# Patient Record
Sex: Male | Born: 1940 | Race: Black or African American | Hispanic: No | Marital: Married | Smoking: Current every day smoker
Health system: Southern US, Community
[De-identification: ages and names within clinical notes are randomized; demographics above are authoritative.]

## PROBLEM LIST (undated history)

## (undated) ENCOUNTER — Emergency Department (HOSPITAL_COMMUNITY): Admission: EM | Payer: Managed Care, Other (non HMO) | Source: Home / Self Care

## (undated) DIAGNOSIS — Z9289 Personal history of other medical treatment: Secondary | ICD-10-CM

## (undated) DIAGNOSIS — M1990 Unspecified osteoarthritis, unspecified site: Secondary | ICD-10-CM

## (undated) DIAGNOSIS — E785 Hyperlipidemia, unspecified: Secondary | ICD-10-CM

## (undated) DIAGNOSIS — I700 Atherosclerosis of aorta: Secondary | ICD-10-CM

## (undated) DIAGNOSIS — R0600 Dyspnea, unspecified: Secondary | ICD-10-CM

## (undated) DIAGNOSIS — I714 Abdominal aortic aneurysm, without rupture, unspecified: Secondary | ICD-10-CM

## (undated) DIAGNOSIS — Z720 Tobacco use: Secondary | ICD-10-CM

## (undated) DIAGNOSIS — I2510 Atherosclerotic heart disease of native coronary artery without angina pectoris: Secondary | ICD-10-CM

## (undated) DIAGNOSIS — D496 Neoplasm of unspecified behavior of brain: Secondary | ICD-10-CM

## (undated) DIAGNOSIS — I219 Acute myocardial infarction, unspecified: Secondary | ICD-10-CM

## (undated) DIAGNOSIS — I10 Essential (primary) hypertension: Secondary | ICD-10-CM

## (undated) HISTORY — DX: Neoplasm of unspecified behavior of brain: D49.6

## (undated) HISTORY — DX: Abdominal aortic aneurysm, without rupture, unspecified: I71.40

## (undated) HISTORY — DX: Atherosclerotic heart disease of native coronary artery without angina pectoris: I25.10

## (undated) HISTORY — DX: Acute myocardial infarction, unspecified: I21.9

## (undated) HISTORY — DX: Abdominal aortic aneurysm, without rupture: I71.4

## (undated) HISTORY — DX: Personal history of other medical treatment: Z92.89

## (undated) HISTORY — PX: BRAIN TUMOR EXCISION: SHX577

## (undated) HISTORY — DX: Tobacco use: Z72.0

## (undated) HISTORY — DX: Hyperlipidemia, unspecified: E78.5

## (undated) HISTORY — DX: Essential (primary) hypertension: I10

---

## 2006-05-29 ENCOUNTER — Emergency Department: Payer: Self-pay | Admitting: Emergency Medicine

## 2013-01-14 DIAGNOSIS — I219 Acute myocardial infarction, unspecified: Secondary | ICD-10-CM

## 2013-01-14 DIAGNOSIS — I2510 Atherosclerotic heart disease of native coronary artery without angina pectoris: Secondary | ICD-10-CM

## 2013-01-14 DIAGNOSIS — I214 Non-ST elevation (NSTEMI) myocardial infarction: Secondary | ICD-10-CM

## 2013-01-14 HISTORY — DX: Atherosclerotic heart disease of native coronary artery without angina pectoris: I25.10

## 2013-01-14 HISTORY — DX: Non-ST elevation (NSTEMI) myocardial infarction: I21.4

## 2013-01-14 HISTORY — PX: CARDIAC CATHETERIZATION: SHX172

## 2013-01-14 HISTORY — DX: Acute myocardial infarction, unspecified: I21.9

## 2013-01-25 ENCOUNTER — Inpatient Hospital Stay: Payer: Self-pay | Admitting: Internal Medicine

## 2013-01-25 DIAGNOSIS — I214 Non-ST elevation (NSTEMI) myocardial infarction: Secondary | ICD-10-CM

## 2013-01-25 DIAGNOSIS — I2510 Atherosclerotic heart disease of native coronary artery without angina pectoris: Secondary | ICD-10-CM

## 2013-01-25 DIAGNOSIS — I369 Nonrheumatic tricuspid valve disorder, unspecified: Secondary | ICD-10-CM

## 2013-01-25 HISTORY — PX: CARDIAC CATHETERIZATION: SHX172

## 2013-01-25 LAB — TROPONIN I: Troponin-I: 17 ng/mL — ABNORMAL HIGH

## 2013-01-25 LAB — BASIC METABOLIC PANEL
Anion Gap: 4 — ABNORMAL LOW (ref 7–16)
BUN: 7 mg/dL (ref 7–18)
Chloride: 105 mmol/L (ref 98–107)
Creatinine: 1.1 mg/dL (ref 0.60–1.30)
Glucose: 103 mg/dL — ABNORMAL HIGH (ref 65–99)
Osmolality: 272 (ref 275–301)
Potassium: 3.5 mmol/L (ref 3.5–5.1)
Sodium: 137 mmol/L (ref 136–145)

## 2013-01-25 LAB — CK TOTAL AND CKMB (NOT AT ARMC)
CK, Total: 868 U/L — ABNORMAL HIGH (ref 35–232)
CK, Total: 934 U/L — ABNORMAL HIGH (ref 35–232)
CK, Total: 788 U/L — ABNORMAL HIGH (ref 35–232)
CK-MB: 57.9 ng/mL — ABNORMAL HIGH (ref 0.5–3.6)
CK-MB: 44.1 ng/mL — ABNORMAL HIGH (ref 0.5–3.6)
CK-MB: 22.9 ng/mL — ABNORMAL HIGH (ref 0.5–3.6)

## 2013-01-25 LAB — APTT: Activated PTT: 36.5 secs — ABNORMAL HIGH (ref 23.6–35.9)

## 2013-01-25 LAB — CBC
HGB: 15.1 g/dL (ref 13.0–18.0)
MCV: 91 fL (ref 80–100)
RBC: 4.73 10*6/uL (ref 4.40–5.90)
WBC: 6.5 10*3/uL (ref 3.8–10.6)

## 2013-01-25 LAB — PROTIME-INR: INR: 1.1

## 2013-01-26 DIAGNOSIS — I214 Non-ST elevation (NSTEMI) myocardial infarction: Secondary | ICD-10-CM

## 2013-01-26 LAB — CBC WITH DIFFERENTIAL/PLATELET
Basophil #: 0.1 10*3/uL (ref 0.0–0.1)
Basophil %: 0.7 %
Eosinophil #: 0 10*3/uL (ref 0.0–0.7)
Eosinophil %: 0.1 %
HCT: 42 % (ref 40.0–52.0)
MCH: 31.7 pg (ref 26.0–34.0)
MCV: 92 fL (ref 80–100)
Neutrophil #: 5.8 10*3/uL (ref 1.4–6.5)
Platelet: 158 10*3/uL (ref 150–440)
RBC: 4.6 10*6/uL (ref 4.40–5.90)
WBC: 8.1 10*3/uL (ref 3.8–10.6)

## 2013-01-26 LAB — LIPID PANEL: HDL Cholesterol: 39 mg/dL — ABNORMAL LOW (ref 40–60)

## 2013-01-26 LAB — BASIC METABOLIC PANEL
Anion Gap: 9 (ref 7–16)
BUN: 9 mg/dL (ref 7–18)
Chloride: 104 mmol/L (ref 98–107)
Creatinine: 1.02 mg/dL (ref 0.60–1.30)
EGFR (African American): >60
EGFR (Non-African Amer.): >60
Osmolality: 272 (ref 275–301)
Potassium: 3.6 mmol/L (ref 3.5–5.1)
Sodium: 137 mmol/L (ref 136–145)

## 2013-01-26 LAB — TSH: Thyroid Stimulating Horm: 1.35 u[IU]/mL

## 2013-01-26 LAB — HEMOGLOBIN A1C: Hemoglobin A1C: 5.7 % (ref 4.2–6.3)

## 2013-02-12 ENCOUNTER — Ambulatory Visit (INDEPENDENT_AMBULATORY_CARE_PROVIDER_SITE_OTHER): Payer: Managed Care, Other (non HMO) | Admitting: Cardiovascular Disease

## 2013-02-12 ENCOUNTER — Encounter: Payer: Self-pay | Admitting: Cardiovascular Disease

## 2013-02-12 ENCOUNTER — Encounter: Payer: Self-pay | Admitting: *Deleted

## 2013-02-12 ENCOUNTER — Telehealth: Payer: Self-pay | Admitting: *Deleted

## 2013-02-12 VITALS — BP 108/74 | HR 62 | Ht 72.0 in | Wt 160.0 lb

## 2013-02-12 DIAGNOSIS — E785 Hyperlipidemia, unspecified: Secondary | ICD-10-CM

## 2013-02-12 DIAGNOSIS — F17200 Nicotine dependence, unspecified, uncomplicated: Secondary | ICD-10-CM

## 2013-02-12 DIAGNOSIS — Z720 Tobacco use: Secondary | ICD-10-CM

## 2013-02-12 DIAGNOSIS — I2510 Atherosclerotic heart disease of native coronary artery without angina pectoris: Secondary | ICD-10-CM

## 2013-02-12 DIAGNOSIS — I10 Essential (primary) hypertension: Secondary | ICD-10-CM

## 2013-02-12 NOTE — Patient Instructions (Addendum)
Fasting Labs in 1 month.  Continue same medications.  Try to quit smoking.  Follow up in 3 months.

## 2013-02-12 NOTE — Telephone Encounter (Signed)
Spoke with Lupita Leash from [facility] pt did not have refills on cardiac medications. She is aware that he can have meds filled for 6 refills. She will update the medications.

## 2013-02-13 ENCOUNTER — Encounter: Payer: Self-pay | Admitting: Cardiovascular Disease

## 2013-02-13 DIAGNOSIS — Z720 Tobacco use: Secondary | ICD-10-CM | POA: Insufficient documentation

## 2013-02-13 DIAGNOSIS — I10 Essential (primary) hypertension: Secondary | ICD-10-CM | POA: Insufficient documentation

## 2013-02-13 DIAGNOSIS — E785 Hyperlipidemia, unspecified: Secondary | ICD-10-CM | POA: Insufficient documentation

## 2013-02-13 NOTE — Assessment & Plan Note (Signed)
I had a prolonged discussion with them about the importance of smoking cessation. He is determined to quit smoking.

## 2013-02-13 NOTE — Progress Notes (Signed)
HPI  This is a 72 year old [place] American man who is here today for a followup visit regarding coronary artery disease. He presented recently to [facility] with non-ST elevation myocardial infarction. EKG showed no acute changes. Peak troponin was 22. Echocardiogram showed normal LV and RV systolic function without significant valvular abnormalities. He underwent urgent cardiac catheterization which showed moderate disease in the mid left circumflex and proximal RCA. Pressure wire interrogation was performed in both and was not obstructive. The left circumflex appeared slightly hazy and possibly the site of a plaque rupture of. No other etiology was found for his elevated troponin. D-dimer was normal and there was no strong clinical suspicion for pulmonary embolism. The patient was treated medically with no recurrent symptoms. He has been doing very well since hospital discharge. He denies any chest pain, dyspnea or palpitations.  No Known Allergies   No current outpatient prescriptions on file prior to visit.   No current facility-administered medications on file prior to visit.     Past Medical History  Diagnosis Date  . Brain tumor   . MI (myocardial infarction)   . Coronary artery disease 01/2013    Presented in June of 2014 with non-ST elevation myocardial infarction(peak troponin of 22). Cardiac catheterization showed moderate nonobstructive disease in the left circumflex and proximal RCA with an FFR ratio of 0.88 and 0.90 respectively. Ejection fraction was 70% and hyperdynamic. MI was likely due to plaque rupture without obstructive disease.   . Tobacco use   . Hypertension   . Hyperlipidemia      Past Surgical History  Procedure Laterality Date  . Brain tumor excision    . Cardiac catheterization  01/2013    [facility];EF 70 %     History reviewed. No pertinent family history.   History   Social History  . Marital Status: Married    Spouse Name: N/A    Number of Children:  N/A  . Years of Education: N/A   Occupational History  . Not on file.   Social History Main Topics  . Smoking status: Current Every Day Smoker -- 1.00 packs/day for 50 years    Types: Cigarettes  . Smokeless tobacco: Not on file  . Alcohol Use: No  . Drug Use: No  . Sexually Active: Not on file   Other Topics Concern  . Not on file   Social History Narrative  . No narrative on file     PHYSICAL EXAM   BP 108/74  Pulse 62  Ht 6' (1.829 m)  Wt 160 lb (72.576 kg)  BMI 21.7 kg/m2 Constitutional: He is oriented to person, place, and time. He appears well-developed and well-nourished. No distress.  HENT: No nasal discharge.  Head: Normocephalic and atraumatic.  Eyes: Pupils are equal and round. Right eye exhibits no discharge. Left eye exhibits no discharge.  Neck: Normal range of motion. Neck supple. No JVD present. No thyromegaly present.  Cardiovascular: Normal rate, regular rhythm, normal heart sounds and. Exam reveals no gallop and no friction rub. No murmur heard.  Pulmonary/Chest: Effort normal and breath sounds normal. No stridor. No respiratory distress. He has no wheezes. He has no rales. He exhibits no tenderness.  Abdominal: Soft. Bowel sounds are normal. He exhibits no distension. There is no tenderness. There is no rebound and no guarding.  Musculoskeletal: Normal range of motion. He exhibits no edema and no tenderness.  Neurological: He is alert and oriented to person, place, and time. Coordination normal.  Skin: Skin  is warm and dry. No rash noted. He is not diaphoretic. No erythema. No pallor.  Psychiatric: He has a normal mood and affect. His behavior is normal. Judgment and thought content normal.  Right radial pulse is normal with no hematoma   NGE:[id number]  Rhythm  WITHIN NORMAL LIMITS    ASSESSMENT AND PLAN

## 2013-02-13 NOTE — Assessment & Plan Note (Signed)
His blood pressure is well controlled on current medications. He did have bradycardia on initial presentation but seems to be tolerating small dose metoprolol.

## 2013-02-13 NOTE — Assessment & Plan Note (Signed)
The patient is doing well after her recent non-ST elevation myocardial infarction. No obstructive disease was found on cardiac catheterization. I recommend continuing medical therapy. He is to stay on dual antiplatelet therapy for one year. Continue treatment with small dose metoprolol, lisinopril, aspirin and Plavix.

## 2013-02-13 NOTE — Assessment & Plan Note (Signed)
Due to his recent myocardial infarction and moderate coronary artery disease, he was started on simvastatin. I will obtain a followup lipid and liver profile in one month

## 2013-02-19 ENCOUNTER — Telehealth: Payer: Self-pay

## 2013-02-19 NOTE — Telephone Encounter (Signed)
Pt asks if safe to be on both ASA 81 mg and plavix 75 mg daily He thinks he is on "too much blood thinner" I explained this is not "too much" and risk of his being off the plavix is greater than staying on it I advised he continue taking both ASA and Plavix as prescribed He denies blood from BM, urine, coughing up blood, etc He will continue meds as prescribed

## 2013-02-19 NOTE — Telephone Encounter (Signed)
Pt is taking aspirin and also a blood thinner, pt states he is too cold, and thinks his blood is too thin. Please call

## 2013-02-21 ENCOUNTER — Encounter: Payer: Self-pay | Admitting: *Deleted

## 2013-03-01 ENCOUNTER — Other Ambulatory Visit: Payer: Self-pay | Admitting: *Deleted

## 2013-03-01 MED ORDER — LISINOPRIL 5 MG PO TABS: 5.0000 mg | ORAL_TABLET | Freq: Every day | ORAL | Status: DC

## 2013-03-01 MED ORDER — SIMVASTATIN 40 MG PO TABS: 40.0000 mg | ORAL_TABLET | Freq: Every day | ORAL | Status: DC

## 2013-03-01 MED ORDER — METOPROLOL TARTRATE 25 MG PO TABS: 12.5000 mg | ORAL_TABLET | Freq: Two times a day (BID) | ORAL | Status: DC

## 2013-03-01 NOTE — Telephone Encounter (Signed)
Refilled Simvastatin, Lisinopril and Metoprolol sent to [facility] pharmacy.

## 2013-03-02 ENCOUNTER — Other Ambulatory Visit: Payer: Self-pay | Admitting: *Deleted

## 2013-03-02 MED ORDER — CLOPIDOGREL BISULFATE 75 MG PO TABS: 75.0000 mg | ORAL_TABLET | Freq: Every day | ORAL | Status: DC

## 2013-03-02 MED ORDER — LISINOPRIL 5 MG PO TABS: 5.0000 mg | ORAL_TABLET | Freq: Every day | ORAL | Status: DC

## 2013-03-02 MED ORDER — METOPROLOL TARTRATE 25 MG PO TABS: 12.5000 mg | ORAL_TABLET | Freq: Two times a day (BID) | ORAL | Status: DC

## 2013-03-02 MED ORDER — SIMVASTATIN 40 MG PO TABS: 40.0000 mg | ORAL_TABLET | Freq: Every day | ORAL | Status: DC

## 2013-03-02 NOTE — Telephone Encounter (Signed)
Refilled Clopidogrel sent to [facility] pharmacy. 

## 2013-03-02 NOTE — Telephone Encounter (Signed)
Refilled Clopidogrel, simvastatin, metoprolol and lisinopril sent to [facility].

## 2013-03-13 ENCOUNTER — Ambulatory Visit (INDEPENDENT_AMBULATORY_CARE_PROVIDER_SITE_OTHER): Payer: Medicare Other

## 2013-03-13 ENCOUNTER — Other Ambulatory Visit: Payer: Medicare Other

## 2013-03-13 DIAGNOSIS — E785 Hyperlipidemia, unspecified: Secondary | ICD-10-CM

## 2013-03-14 LAB — LIPID PANEL
Cholesterol, Total: 137 mg/dL (ref 100–199)
HDL: 29 mg/dL — ABNORMAL LOW (ref >39)
VLDL Cholesterol Cal: 19 mg/dL (ref 5–40)

## 2013-03-14 LAB — HEPATIC FUNCTION PANEL: ALT: 12 IU/L (ref 0–44)

## 2013-03-30 ENCOUNTER — Telehealth: Payer: Self-pay | Admitting: *Deleted

## 2013-03-30 NOTE — Telephone Encounter (Signed)
Spoke with pt, he works as a Engineer, water in the school. He was given the okay to return to work.

## 2013-03-30 NOTE — Telephone Encounter (Signed)
Patient wants to know when he can go back to work.

## 2013-05-08 ENCOUNTER — Ambulatory Visit: Payer: Medicare Other | Admitting: Cardiovascular Disease

## 2013-05-10 ENCOUNTER — Ambulatory Visit: Payer: Medicare Other | Admitting: Cardiovascular Disease

## 2013-05-24 ENCOUNTER — Encounter: Payer: Self-pay | Admitting: Cardiovascular Disease

## 2013-05-24 ENCOUNTER — Ambulatory Visit (INDEPENDENT_AMBULATORY_CARE_PROVIDER_SITE_OTHER): Payer: Medicare Other | Admitting: Cardiovascular Disease

## 2013-05-24 VITALS — BP 132/89 | HR 50 | Ht 72.0 in | Wt 160.5 lb

## 2013-05-24 DIAGNOSIS — I2510 Atherosclerotic heart disease of native coronary artery without angina pectoris: Secondary | ICD-10-CM

## 2013-05-24 DIAGNOSIS — F17200 Nicotine dependence, unspecified, uncomplicated: Secondary | ICD-10-CM

## 2013-05-24 DIAGNOSIS — Z720 Tobacco use: Secondary | ICD-10-CM

## 2013-05-24 DIAGNOSIS — E785 Hyperlipidemia, unspecified: Secondary | ICD-10-CM

## 2013-05-24 DIAGNOSIS — I10 Essential (primary) hypertension: Secondary | ICD-10-CM

## 2013-05-24 NOTE — Assessment & Plan Note (Signed)
Blood pressure is reasonably controlled. I might consider stopping his metoprolol and increasing lisinopril in the future.

## 2013-05-24 NOTE — Assessment & Plan Note (Signed)
I discussed with him again the importance of smoking cessation.

## 2013-05-24 NOTE — Patient Instructions (Signed)
Continue same medications.   Your physician wants you to follow-up in: 6 months.  You will receive a reminder letter in the mail two months in advance. If you don't receive a letter, please call our office to schedule the follow-up appointment.  

## 2013-05-24 NOTE — Assessment & Plan Note (Signed)
Lab Results  Component Value Date   HDL 29* 03/13/2013   LDLCALC 89 03/13/2013   TRIG 93 03/13/2013   CHOLHDL 4.7 03/13/2013   Continue treatment with simvastatin. 

## 2013-05-24 NOTE — Assessment & Plan Note (Signed)
He is doing very well at this time with no recurrent chest pain. I recommend dual antiplatelet therapy for at least one year.

## 2013-05-24 NOTE — Progress Notes (Signed)
HPI  This is a 72 year old [place] American man who is here today for a followup visit regarding coronary artery disease. He presented in 01/2013 to [facility] with non-ST elevation myocardial infarction. EKG showed no acute changes. Peak troponin was 22. Echocardiogram showed normal LV and RV systolic function without significant valvular abnormalities. He underwent urgent cardiac catheterization which showed moderate disease in the mid left circumflex and proximal RCA. Pressure wire interrogation was performed in both and was not obstructive. The left circumflex appeared slightly hazy and possibly the site of a plaque rupture . No other etiology was found for his elevated troponin. D-dimer was normal and there was no strong clinical suspicion for pulmonary embolism. The patient was treated medically with no recurrent symptoms.  He has been doing very well since hospital discharge. He denies any chest pain, dyspnea or palpitations. He has mild bradycardia but denies any dizziness.   No Known Allergies   Current Outpatient Prescriptions on File Prior to Visit  Medication Sig Dispense Refill  . aspirin 81 MG tablet Take 81 mg by mouth daily.      . clopidogrel (PLAVIX) 75 MG tablet Take 1 tablet (75 mg total) by mouth daily.  30 tablet  3  . lisinopril (PRINIVIL,ZESTRIL) 5 MG tablet Take 1 tablet (5 mg total) by mouth daily.  30 tablet  3  . metoprolol tartrate (LOPRESSOR) 25 MG tablet Take 0.5 tablets (12.5 mg total) by mouth 2 (two) times daily.  30 tablet  3  . NITROSTAT 0.4 MG SL tablet Place 0.4 mg under the tongue every 5 (five) minutes as needed.       . simvastatin (ZOCOR) 40 MG tablet Take 1 tablet (40 mg total) by mouth at bedtime.  30 tablet  3   No current facility-administered medications on file prior to visit.     Past Medical History  Diagnosis Date  . Brain tumor   . MI (myocardial infarction)   . Coronary artery disease 01/2013    Presented in June of 2014 with non-ST  elevation myocardial infarction(peak troponin of 22). Cardiac catheterization showed moderate nonobstructive disease in the left circumflex and proximal RCA with an FFR ratio of 0.88 and 0.90 respectively. Ejection fraction was 70% and hyperdynamic. MI was likely due to plaque rupture without obstructive disease.   . Tobacco use   . Hypertension   . Hyperlipidemia      Past Surgical History  Procedure Laterality Date  . Brain tumor excision    . Cardiac catheterization  01/2013    [facility];EF 70 %     Family History  Problem Relation Age of Onset  . Family history unknown: Yes     History   Social History  . Marital Status: Married    Spouse Name: N/A    Number of Children: N/A  . Years of Education: N/A   Occupational History  . Not on file.   Social History Main Topics  . Smoking status: Current Every Day Smoker -- 1.00 packs/day for 50 years    Types: Cigarettes  . Smokeless tobacco: Not on file  . Alcohol Use: No  . Drug Use: No  . Sexual Activity: Not on file   Other Topics Concern  . Not on file   Social History Narrative  . No narrative on file     PHYSICAL EXAM   BP 132/89  Pulse 50  Ht 6' (1.829 m)  Wt 160 lb 8 oz (72.802 kg)  BMI 21.76  kg/m2 Constitutional: He is oriented to person, place, and time. He appears well-developed and well-nourished. No distress.  HENT: No nasal discharge.  Head: Normocephalic and atraumatic.  Eyes: Pupils are equal and round. Right eye exhibits no discharge. Left eye exhibits no discharge.  Neck: Normal range of motion. Neck supple. No JVD present. No thyromegaly present.  Cardiovascular: Normal rate, regular rhythm, normal heart sounds and. Exam reveals no gallop and no friction rub. No murmur heard.  Pulmonary/Chest: Effort normal and breath sounds normal. No stridor. No respiratory distress. He has no wheezes. He has no rales. He exhibits no tenderness.  Abdominal: Soft. Bowel sounds are normal. He exhibits no  distension. There is no tenderness. There is no rebound and no guarding.  Musculoskeletal: Normal range of motion. He exhibits no edema and no tenderness.  Neurological: He is alert and oriented to person, place, and time. Coordination normal.  Skin: Skin is warm and dry. No rash noted. He is not diaphoretic. No erythema. No pallor.  Psychiatric: He has a normal mood and affect. His behavior is normal. Judgment and thought content normal.     EKG: sinus bradycardia with early repolarization  ASSESSMENT AND PLAN

## 2013-06-08 ENCOUNTER — Telehealth: Payer: Self-pay

## 2013-06-08 NOTE — Telephone Encounter (Signed)
Received fax from cardiac rehab at [facility] that pt did not show for appt, "no desire to do program".

## 2013-06-29 ENCOUNTER — Other Ambulatory Visit: Payer: Self-pay

## 2013-06-29 MED ORDER — LISINOPRIL 5 MG PO TABS: 5.0000 mg | ORAL_TABLET | Freq: Every day | ORAL | Status: DC

## 2013-06-29 MED ORDER — SIMVASTATIN 40 MG PO TABS: 40.0000 mg | ORAL_TABLET | Freq: Every day | ORAL | Status: DC

## 2013-06-29 MED ORDER — METOPROLOL TARTRATE 25 MG PO TABS: 12.5000 mg | ORAL_TABLET | Freq: Two times a day (BID) | ORAL | Status: DC

## 2013-06-29 MED ORDER — CLOPIDOGREL BISULFATE 75 MG PO TABS: 75.0000 mg | ORAL_TABLET | Freq: Every day | ORAL | Status: DC

## 2013-06-29 NOTE — Telephone Encounter (Signed)
Requested Prescriptions   Signed Prescriptions Disp Refills  . simvastatin (ZOCOR) 40 MG tablet 30 tablet 3    Sig: Take 1 tablet (40 mg total) by mouth at bedtime.    Authorizing Provider: Lorine Bears A    Ordering User: Shawnie Dapper, MARINA C  . lisinopril (PRINIVIL,ZESTRIL) 5 MG tablet 30 tablet 3    Sig: Take 1 tablet (5 mg total) by mouth daily.    Authorizing Provider: Lorine Bears A    Ordering User: Iverson Alamin C  . clopidogrel (PLAVIX) 75 MG tablet 30 tablet 3    Sig: Take 1 tablet (75 mg total) by mouth daily.    Authorizing Provider: Lorine Bears A    Ordering User: Shawnie Dapper, MARINA C  . metoprolol tartrate (LOPRESSOR) 25 MG tablet 30 tablet 3    Sig: Take 0.5 tablets (12.5 mg total) by mouth 2 (two) times daily.    Authorizing Provider: Lorine Bears A    Ordering User: Kendrick Fries

## 2013-07-16 ENCOUNTER — Telehealth: Payer: Self-pay

## 2013-07-16 ENCOUNTER — Telehealth: Payer: Self-pay | Admitting: *Deleted

## 2013-07-16 NOTE — Telephone Encounter (Signed)
This encounter was created in error - please disregard.

## 2013-07-16 NOTE — Telephone Encounter (Signed)
Called pt to inform him that his message was recd and sent to MD.

## 2013-07-16 NOTE — Telephone Encounter (Signed)
Pt states it is ok to leave a msg on his vm

## 2013-07-16 NOTE — Telephone Encounter (Signed)
Pt questions if he can take Alka Seltzer Plus and Nyquil. Please advise.

## 2013-07-16 NOTE — Telephone Encounter (Signed)
Left detailed message for pt with instructions.   Asked pt to call back with any questions or concerns.

## 2013-07-16 NOTE — Telephone Encounter (Signed)
Nyquil cough is ok. He should avoid Alka Seltzer plus because it has it has phenylephrine. In general , he should avoid decongestants.

## 2013-07-16 NOTE — Telephone Encounter (Signed)
Alka seltzer plus and niquil for a cold. Please advise (second call per [place])

## 2013-07-20 ENCOUNTER — Emergency Department: Payer: Self-pay | Admitting: Emergency Medicine

## 2013-07-20 LAB — COMPREHENSIVE METABOLIC PANEL
Albumin: 3.7 g/dL (ref 3.4–5.0)
Anion Gap: 2 — ABNORMAL LOW (ref 7–16)
BUN: 12 mg/dL (ref 7–18)
Creatinine: 1.22 mg/dL (ref 0.60–1.30)
EGFR (African American): >60
EGFR (Non-African Amer.): 60 — ABNORMAL LOW
Osmolality: 264 (ref 275–301)
Potassium: 3.5 mmol/L (ref 3.5–5.1)
SGOT(AST): 59 U/L — ABNORMAL HIGH (ref 15–37)
SGPT (ALT): 29 U/L (ref 12–78)
Sodium: 131 mmol/L — ABNORMAL LOW (ref 136–145)

## 2013-07-20 LAB — CK TOTAL AND CKMB (NOT AT ARMC): CK-MB: 0.6 ng/mL (ref 0.5–3.6)

## 2013-07-20 LAB — CBC
HCT: 44.3 % (ref 40.0–52.0)
MCH: 31.5 pg (ref 26.0–34.0)
MCHC: 34.3 g/dL (ref 32.0–36.0)
MCV: 92 fL (ref 80–100)
Platelet: 103 10*3/uL — ABNORMAL LOW (ref 150–440)
RBC: 4.82 10*6/uL (ref 4.40–5.90)

## 2013-07-20 LAB — TROPONIN I: Troponin-I: <0.02 ng/mL

## 2013-11-22 ENCOUNTER — Ambulatory Visit (INDEPENDENT_AMBULATORY_CARE_PROVIDER_SITE_OTHER): Payer: Managed Care, Other (non HMO) | Admitting: Cardiovascular Disease

## 2013-11-22 ENCOUNTER — Encounter: Payer: Self-pay | Admitting: Cardiovascular Disease

## 2013-11-22 VITALS — BP 123/75 | HR 49 | Ht 73.0 in | Wt 164.8 lb

## 2013-11-22 DIAGNOSIS — I10 Essential (primary) hypertension: Secondary | ICD-10-CM

## 2013-11-22 DIAGNOSIS — F17200 Nicotine dependence, unspecified, uncomplicated: Secondary | ICD-10-CM

## 2013-11-22 DIAGNOSIS — Z720 Tobacco use: Secondary | ICD-10-CM

## 2013-11-22 DIAGNOSIS — R200 Anesthesia of skin: Secondary | ICD-10-CM

## 2013-11-22 DIAGNOSIS — R209 Unspecified disturbances of skin sensation: Secondary | ICD-10-CM

## 2013-11-22 DIAGNOSIS — I2510 Atherosclerotic heart disease of native coronary artery without angina pectoris: Secondary | ICD-10-CM

## 2013-11-22 DIAGNOSIS — E785 Hyperlipidemia, unspecified: Secondary | ICD-10-CM

## 2013-11-22 MED ORDER — LISINOPRIL 10 MG PO TABS: 10.0000 mg | ORAL_TABLET | Freq: Every day | ORAL | Status: DC

## 2013-11-22 NOTE — Assessment & Plan Note (Signed)
I stopped metoprolol and increased lisinopril to 10 mg once daily due to bradycardia.

## 2013-11-22 NOTE — Patient Instructions (Signed)
Your physician has recommended you make the following change in your medication:  Stop Metoprolol  Increase Lisinopril to 10 mg once daily   Your physician wants you to follow-up in: 6 months. You will receive a reminder letter in the mail two months in advance. If you don't receive a letter, please call our office to schedule the follow-up appointment.

## 2013-11-22 NOTE — Progress Notes (Signed)
HPI  This is a 73 year old [place] American man who is here today for a followup visit regarding coronary artery disease. He presented in 01/2013 to [facility] with non-ST elevation myocardial infarction. EKG showed no acute changes. Peak troponin was 22. Echocardiogram showed normal LV and RV systolic function without significant valvular abnormalities. He underwent urgent cardiac catheterization which showed moderate disease in the mid left circumflex and proximal RCA. Pressure wire interrogation was performed on both and was not obstructive. The left circumflex appeared slightly hazy and possibly the site of a plaque rupture . No other etiology was found for his elevated troponin. D-dimer was normal and there was no strong clinical suspicion for pulmonary embolism. The patient was treated medically with no recurrent symptoms.  He has been doing very well since hospital discharge. He denies any chest pain, dyspnea or palpitations. He has mild bradycardia but denies any dizziness.  He continues to be bradycardic but denies symptoms. He continues to smoke half a pack per day.  No Known Allergies   Current Outpatient Prescriptions on File Prior to Visit  Medication Sig Dispense Refill  . aspirin 81 MG tablet Take 81 mg by mouth daily.      . clopidogrel (PLAVIX) 75 MG tablet Take 1 tablet (75 mg total) by mouth daily.  30 tablet  3  . lisinopril (PRINIVIL,ZESTRIL) 5 MG tablet Take 1 tablet (5 mg total) by mouth daily.  30 tablet  3  . metoprolol tartrate (LOPRESSOR) 25 MG tablet Take 0.5 tablets (12.5 mg total) by mouth 2 (two) times daily.  30 tablet  3  . NITROSTAT 0.4 MG SL tablet Place 0.4 mg under the tongue every 5 (five) minutes as needed.       . simvastatin (ZOCOR) 40 MG tablet Take 1 tablet (40 mg total) by mouth at bedtime.  30 tablet  3   No current facility-administered medications on file prior to visit.     Past Medical History  Diagnosis Date  . Brain tumor   . MI (myocardial  infarction)   . Coronary artery disease 01/2013    Presented in June of 2014 with non-ST elevation myocardial infarction(peak troponin of 22). Cardiac catheterization showed moderate nonobstructive disease in the left circumflex and proximal RCA with an FFR ratio of 0.88 and 0.90 respectively. Ejection fraction was 70% and hyperdynamic. MI was likely due to plaque rupture without obstructive disease.   . Tobacco use   . Hypertension   . Hyperlipidemia      Past Surgical History  Procedure Laterality Date  . Brain tumor excision    . Cardiac catheterization  01/2013    [facility];EF 70 %     Family History  Problem Relation Age of Onset  . Family history unknown: Yes     History   Social History  . Marital Status: Married    Spouse Name: N/A    Number of Children: N/A  . Years of Education: N/A   Occupational History  . Not on file.   Social History Main Topics  . Smoking status: Current Every Day Smoker -- 1.00 packs/day for 50 years    Types: Cigarettes  . Smokeless tobacco: Not on file  . Alcohol Use: No  . Drug Use: No  . Sexual Activity: Not on file   Other Topics Concern  . Not on file   Social History Narrative  . No narrative on file     PHYSICAL EXAM   BP 123/75  Pulse 49  Ht 6\' 1"  (1.854 m)  Wt 164 lb 12 oz (74.73 kg)  BMI 21.74 kg/m2 Constitutional: He is oriented to person, place, and time. He appears well-developed and well-nourished. No distress.  HENT: No nasal discharge.  Head: Normocephalic and atraumatic.  Eyes: Pupils are equal and round. Right eye exhibits no discharge. Left eye exhibits no discharge.  Neck: Normal range of motion. Neck supple. No JVD present. No thyromegaly present.  Cardiovascular: Bradycardic, regular rhythm, normal heart sounds and. Exam reveals no gallop and no friction rub. No murmur heard.  Pulmonary/Chest: Effort normal and breath sounds normal. No stridor. No respiratory distress. He has no wheezes. He has no rales.  He exhibits no tenderness.  Abdominal: Soft. Bowel sounds are normal. He exhibits no distension. There is no tenderness. There is no rebound and no guarding.  Musculoskeletal: Normal range of motion. He exhibits no edema and no tenderness.  Neurological: He is alert and oriented to person, place, and time. Coordination normal.  Skin: Skin is warm and dry. No rash noted. He is not diaphoretic. No erythema. No pallor.  Psychiatric: He has a normal mood and affect. His behavior is normal. Judgment and thought content normal.     EKG: Marked sinus  Bradycardia  Low voltage in limb leads.   -ST depression   +   T-abnormality  - Inferior ischemia.   ABNORMAL    ASSESSMENT AND PLAN

## 2013-11-22 NOTE — Assessment & Plan Note (Signed)
Lab Results  Component Value Date   HDL 29* 03/13/2013   LDLCALC 89 03/13/2013   TRIG 93 03/13/2013   CHOLHDL 4.7 03/13/2013   Continue treatment with simvastatin.

## 2013-11-22 NOTE — Assessment & Plan Note (Signed)
EKG significantly abnormal today. However, he is completely asymptomatic. Continue medical therapy. Given her significant bradycardia, I stopped metoprolol.

## 2013-11-22 NOTE — Assessment & Plan Note (Signed)
I discussed with him the importance of smoking cessation. 

## 2014-01-21 ENCOUNTER — Telehealth: Payer: Self-pay | Admitting: *Deleted

## 2014-01-21 NOTE — Telephone Encounter (Signed)
Patient called and wants to know if he should continue his blood thinner (clopidogrer 75mg )?

## 2014-01-22 ENCOUNTER — Telehealth: Payer: Self-pay | Admitting: *Deleted

## 2014-01-22 NOTE — Telephone Encounter (Signed)
We can stop Plavix now. Continue other medications.

## 2014-01-22 NOTE — Telephone Encounter (Signed)
Patient called and asked if he should continue his Plavix  He stated he thought he should only be on it for a year and the end of the year is close

## 2014-01-23 NOTE — Telephone Encounter (Signed)
Informed patient that per Dr. Fletcher Anon he can stop his Plavix and continue his other meds  Patient verbalized understanding

## 2014-02-13 NOTE — Telephone Encounter (Signed)
This encounter was created in error - please disregard.

## 2014-02-28 ENCOUNTER — Other Ambulatory Visit: Payer: Self-pay | Admitting: *Deleted

## 2014-02-28 MED ORDER — SIMVASTATIN 40 MG PO TABS: 40.0000 mg | ORAL_TABLET | Freq: Every day | ORAL | Status: DC

## 2014-02-28 NOTE — Telephone Encounter (Signed)
Requested Prescriptions   Signed Prescriptions Disp Refills  . simvastatin (ZOCOR) 40 MG tablet 30 tablet 3    Sig: Take 1 tablet (40 mg total) by mouth at bedtime.    Authorizing Provider: Kathlyn Sacramento A    Ordering User: Britt Bottom

## 2014-05-30 ENCOUNTER — Ambulatory Visit (INDEPENDENT_AMBULATORY_CARE_PROVIDER_SITE_OTHER): Payer: Medicare Other | Admitting: Cardiovascular Disease

## 2014-05-30 ENCOUNTER — Encounter: Payer: Self-pay | Admitting: Cardiovascular Disease

## 2014-05-30 VITALS — BP 138/90 | HR 56 | Ht 72.0 in | Wt 164.2 lb

## 2014-05-30 DIAGNOSIS — E785 Hyperlipidemia, unspecified: Secondary | ICD-10-CM

## 2014-05-30 DIAGNOSIS — I10 Essential (primary) hypertension: Secondary | ICD-10-CM

## 2014-05-30 DIAGNOSIS — I2510 Atherosclerotic heart disease of native coronary artery without angina pectoris: Secondary | ICD-10-CM

## 2014-05-30 DIAGNOSIS — Z720 Tobacco use: Secondary | ICD-10-CM

## 2014-05-30 MED ORDER — ATORVASTATIN CALCIUM 20 MG PO TABS: 20.0000 mg | ORAL_TABLET | Freq: Every day | ORAL | Status: DC

## 2014-05-30 NOTE — Assessment & Plan Note (Signed)
Blood pressure is reasonably controlled on lisinopril.

## 2014-05-30 NOTE — Patient Instructions (Signed)
Stop Simvastatin.  Start Atorvastatin 20 mg once daily.   Check fasting labs in 6 weeks.   Your physician wants you to follow-up in: 6 months.  You will receive a reminder letter in the mail two months in advance. If you don't receive a letter, please call our office to schedule the follow-up appointment.  Your next appointment will be scheduled in our new office located at :  [place]  [address], [place]  [place], [place] [address]

## 2014-05-30 NOTE — Assessment & Plan Note (Signed)
Due to possible myalgia with simvastatin, I switched him to atorvastatin 20 mg daily. Check fasting lipid and liver profile in 6 weeks.

## 2014-05-30 NOTE — Assessment & Plan Note (Signed)
He is doing well overall with no symptoms suggestive of angina. Continue medical therapy. Plavix was discontinued after 12 months of treatment.

## 2014-05-30 NOTE — Assessment & Plan Note (Signed)
I again discussed with him the importance of complete cessation. He is down to a few cigarettes a day.

## 2014-05-30 NOTE — Progress Notes (Signed)
HPI  This is a 73 year old [place] American man who is here today for a followup visit regarding coronary artery disease. He presented in 01/2013 to [facility] with non-ST elevation myocardial infarction. EKG showed no acute changes. Peak troponin was 22. Echocardiogram showed normal LV and RV systolic function without significant valvular abnormalities. He underwent urgent cardiac catheterization which showed moderate disease in the mid left circumflex and proximal RCA. Pressure wire interrogation was performed on both and was not obstructive. The left circumflex appeared slightly hazy and possibly the site of a plaque rupture . No other etiology was found for his elevated troponin. D-dimer was normal and there was no strong clinical suspicion for pulmonary embolism. The patient was treated medically with no recurrent symptoms.  He denies any chest pain, dyspnea or palpitations. He has mild bradycardia but denies any dizziness. Metoprolol was discontinued.  He continues to smoke but is down to a few cigarettes a day. He complains of some on discomfort and thinks it might be related to simvastatin.  No Known Allergies   Current Outpatient Prescriptions on File Prior to Visit  Medication Sig Dispense Refill  . aspirin 81 MG tablet Take 81 mg by mouth daily.      Marland Kitchen lisinopril (PRINIVIL,ZESTRIL) 10 MG tablet Take 1 tablet (10 mg total) by mouth daily.  90 tablet  3  . NITROSTAT 0.4 MG SL tablet Place 0.4 mg under the tongue every 5 (five) minutes as needed.       . simvastatin (ZOCOR) 40 MG tablet Take 1 tablet (40 mg total) by mouth at bedtime.  30 tablet  3   No current facility-administered medications on file prior to visit.     Past Medical History  Diagnosis Date  . Brain tumor   . MI (myocardial infarction)   . Coronary artery disease 01/2013    Presented in June of 2014 with non-ST elevation myocardial infarction(peak troponin of 22). Cardiac catheterization showed moderate  nonobstructive disease in the left circumflex and proximal RCA with an FFR ratio of 0.88 and 0.90 respectively. Ejection fraction was 70% and hyperdynamic. MI was likely due to plaque rupture without obstructive disease.   . Tobacco use   . Hypertension   . Hyperlipidemia      Past Surgical History  Procedure Laterality Date  . Brain tumor excision    . Cardiac catheterization  01/2013    [facility];EF 70 %     History reviewed. No pertinent family history.   History   Social History  . Marital Status: Married    Spouse Name: N/A    Number of Children: N/A  . Years of Education: N/A   Occupational History  . Not on file.   Social History Main Topics  . Smoking status: Current Every Day Smoker -- 1.00 packs/day for 50 years    Types: Cigarettes  . Smokeless tobacco: Not on file  . Alcohol Use: No  . Drug Use: No  . Sexual Activity: Not on file   Other Topics Concern  . Not on file   Social History Narrative  . No narrative on file     PHYSICAL EXAM   BP 138/90  Pulse 56  Ht 6' (1.829 m)  Wt 164 lb 4 oz (74.503 kg)  BMI 22.27 kg/m2 Constitutional: He is oriented to person, place, and time. He appears well-developed and well-nourished. No distress.  HENT: No nasal discharge.  Head: Normocephalic and atraumatic.  Eyes: Pupils are equal and round.  Right eye exhibits no discharge. Left eye exhibits no discharge.  Neck: Normal range of motion. Neck supple. No JVD present. No thyromegaly present.  Cardiovascular: Bradycardic, regular rhythm, normal heart sounds and. Exam reveals no gallop and no friction rub. No murmur heard.  Pulmonary/Chest: Effort normal and breath sounds normal. No stridor. No respiratory distress. He has no wheezes. He has no rales. He exhibits no tenderness.  Abdominal: Soft. Bowel sounds are normal. He exhibits no distension. There is no tenderness. There is no rebound and no guarding.  Musculoskeletal: Normal range of motion. He exhibits no edema  and no tenderness.  Neurological: He is alert and oriented to person, place, and time. Coordination normal.  Skin: Skin is warm and dry. No rash noted. He is not diaphoretic. No erythema. No pallor.  Psychiatric: He has a normal mood and affect. His behavior is normal. Judgment and thought content normal.     EKG: Sinus  Bradycardia  WITHIN NORMAL LIMITS   ASSESSMENT AND PLAN

## 2014-07-09 ENCOUNTER — Other Ambulatory Visit (INDEPENDENT_AMBULATORY_CARE_PROVIDER_SITE_OTHER): Payer: Medicare Other

## 2014-07-09 DIAGNOSIS — E785 Hyperlipidemia, unspecified: Secondary | ICD-10-CM

## 2014-07-10 LAB — HEPATIC FUNCTION PANEL
ALT: 9 IU/L (ref 0–44)
AST: 14 IU/L (ref 0–40)
Albumin: 4.5 g/dL (ref 3.5–4.8)
Alkaline Phosphatase: 74 IU/L (ref 39–117)
BILIRUBIN DIRECT: 0.13 mg/dL (ref 0.00–0.40)
Total Bilirubin: 0.5 mg/dL (ref 0.0–1.2)
Total Protein: 7.4 g/dL (ref 6.0–8.5)

## 2014-07-10 LAB — LIPID PANEL
Chol/HDL Ratio: 4.2 ratio units (ref 0.0–5.0)
Cholesterol, Total: 130 mg/dL (ref 100–199)
HDL: 31 mg/dL — AB (ref >39)
LDL CALC: 82 mg/dL (ref 0–99)
TRIGLYCERIDES: 85 mg/dL (ref 0–149)
VLDL Cholesterol Cal: 17 mg/dL (ref 5–40)

## 2014-07-12 ENCOUNTER — Other Ambulatory Visit: Payer: Medicare Other

## 2014-07-16 NOTE — Progress Notes (Signed)
LVM 12/01

## 2014-07-17 ENCOUNTER — Telehealth: Payer: Self-pay | Admitting: Cardiovascular Disease

## 2014-07-17 NOTE — Telephone Encounter (Signed)
See result note.  

## 2014-07-17 NOTE — Telephone Encounter (Signed)
Calling about labs, wants his results. Please call patient

## 2014-09-10 ENCOUNTER — Telehealth: Payer: Self-pay | Admitting: Cardiovascular Disease

## 2014-09-10 NOTE — Telephone Encounter (Signed)
Patient is taking simvastatin and was changes to atorvastatin  He stated he is still having pain in his legs On the days he forgets to take his atorvastatin his legs do not hurt as bad  He is having numbness in his arms  He thinks this is all related to his medication  He wants to know if he can stop this medication since his cholesterol is not elevated

## 2014-09-10 NOTE — Telephone Encounter (Signed)
See below

## 2014-09-10 NOTE — Telephone Encounter (Signed)
Pt is calling stating that the crestor that was changed in dosage, it doesn't seem to be working.  May want to come off it, could not hardly understand what he wanted me to do.  Pt stated that he is still getting numbness in the arm and doesn't know why.   Please call and if you can't get a hold of him, please leave message on answering machine.

## 2014-09-11 MED ORDER — PRAVASTATIN SODIUM 40 MG PO TABS: 40.0000 mg | ORAL_TABLET | Freq: Every evening | ORAL | Status: DC

## 2014-09-11 NOTE — Telephone Encounter (Signed)
Switch Atorvastatin to Pravastatin 40 mg daily.

## 2014-09-11 NOTE — Telephone Encounter (Signed)
Informed patient of Dr. Jacklynn Ganong instructions  Patient verbalized understanding  Instruction patient to call if issues persist

## 2014-12-06 NOTE — Consult Note (Signed)
General Aspect This is a 74 year old african American male with prolonged hiustory of smoking but no other health issues who presented with substernal chest aching and tightness which started around noon yesterday. This was associated with nausea and diaphoresis. No previous similar episodes.  In ED: he is noted to have stable vital signs. ECG without ischemic changes. TnI: 17. The patient was started on Aspirin and Heparin. However, he continues to complain of mild chest discomfort.   Physical Exam:  GEN no acute distress   NECK supple   RESP normal resp effort  clear BS   CARD Regular rate and rhythm  No murmur   ABD denies tenderness   LYMPH negative neck   EXTR negative cyanosis/clubbing, negative edema   PSYCH alert, A+O to time, place, person   Review of Systems:  Subjective/Chief Complaint chest pain   General: Fatigue   Skin: No Complaints   ENT: No Complaints   Eyes: No Complaints   Neck: No Complaints   Respiratory: No Complaints   Cardiovascular: Chest pain or discomfort  Tightness   Gastrointestinal: No Complaints   Genitourinary: No Complaints   Vascular: No Complaints   Musculoskeletal: No Complaints   Neurologic: No Complaints   Hematologic: No Complaints   Endocrine: No Complaints   Psychiatric: No Complaints   Review of Systems: All other systems were reviewed and found to be negative     denies med hx:    Brain Surgery:   Lab Results: Routine Chem:  12-Jun-14 05:09   Glucose, Serum  103  BUN 7  Creatinine (comp) 1.10  Sodium, Serum 137  Potassium, Serum 3.5  Chloride, Serum 105  CO2, Serum 28  Calcium (Total), Serum 9.5  Anion Gap  4  Osmolality (calc) 272  eGFR (African American) >60  eGFR (Non-African American) >60 (eGFR values <14m/min/1.73 m2 may be an indication of chronic kidney disease (CKD). Calculated eGFR is useful in patients with stable renal function. The eGFR calculation will not be reliable in acutely  ill patients when serum creatinine is changing rapidly. It is not useful in  patients on dialysis. The eGFR calculation may not be applicable to patients at the low and high extremes of body sizes, pregnant women, and vegetarians.)  Result Comment TROPONIN - RESULTS VERIFIED BY REPEAT TESTING.  - C/ [email or web address] _0  01-25-13. AJO  - READ-BACK PROCESS PERFORMED.  Result(s) reported on 25 Jan 2013 at 05:57AM.  Cardiac:  12-Jun-14 05:09   CK, Total  868  CPK-MB, Serum  57.9 (Result(s) reported on 25 Jan 2013 at 05:52AM.)  Troponin I  17.00 (0.00-0.05 0.05 ng/mL or less: NEGATIVE  Repeat testing in 3-6 hrs  if clinically indicated. >0.05 ng/mL: POTENTIAL  MYOCARDIAL INJURY. Repeat  testing in 3-6 hrs if  clinically indicated. NOTE: An increase or decrease  of 30% or more on serial  testing suggests a  clinically important change)  Routine Coag:  12-Jun-14 05:09   Prothrombin 13.9  INR 1.1 (INR reference interval applies to patients on anticoagulant therapy. A single INR therapeutic range for coumarins is not optimal for all indications; however, the suggested range for most indications is 2.0 - 3.0. Exceptions to the INR Reference Range may include: Prosthetic heart valves, acute myocardial infarction, prevention of myocardial infarction, and combinations of aspirin and anticoagulant. The need for a higher or lower target INR must be assessed individually. Reference: The Pharmacology and Management of the Vitamin K  antagonists: the seventh ACCP Conference on Antithrombotic  and Thrombolytic Therapy. BWGYK.5993 Sept:126 (3suppl): [id number]. A HCT value >55% may artifactually increase the PT.  In one study,  the increase was an average of 25%. Reference:  "Effect on Routine and Special Coagulation Testing Values of Citrate Anticoagulant Adjustment in Patients with High HCT Values." American Journal of Clinical Pathology 2006;126:400-405.)  Activated PTT (APTT)  36.5 (A HCT  value >55% may artifactually increase the APTT. In one study, the increase was an average of 19%. Reference: "Effect on Routine and Special Coagulation Testing Values of Citrate Anticoagulant Adjustment in Patients with High HCT Values." American Journal of Clinical Pathology 2006;126:400-405.)  Routine Hem:  12-Jun-14 05:09   WBC (CBC) 6.5  RBC (CBC) 4.73  Hemoglobin (CBC) 15.1  Hematocrit (CBC) 43.3  Platelet Count (CBC) 184 (Result(s) reported on 25 Jan 2013 at 05:38AM.)  MCV 91   EKG:  EKG Nml  NSR   Radiology Results: XRay:    12-Jun-14 05:21, Chest PA and Lateral  Chest PA and Lateral   REASON FOR EXAM:    CP  COMMENTS:       PROCEDURE: DXR - DXR CHEST PA (OR AP) AND LATERAL  - Jan 25 2013  5:21AM     RESULT: The lungs are well-expanded. There is no focal infiltrate. The   cardiac silhouette is normal in size. The mediastinum is normal in width.   There is no pleural effusion. The bony thorax exhibits no acute   abnormality.    IMPRESSION:  There is mild hyperinflation which may be voluntary or could   reflect underlying COPD. There is no evidence of CHF nor pneumonia nor   other acute cardiopulmonary abnormality.     Dictation Site: 1    Verified By: DAVID A. Martinique, M.D., MD    No Known Allergies:    Impression 1. NSTEMI: no diagnostic ECG changes. However, with ongoing chest pain in spite of Heparin and Aspirin.  I recommend proceeding with urgent cardiac cath and possible PCI. Risks, benefits and alternatives were discussed.  Continue Aspirin and Heparin. Start high dose Statin. No beta blocker for now due to bradycardia.   2. Tobacco use: will need to councel about smoking cessation.   Electronic Signatures: Kathlyn Sacramento (MD)  (Signed 12-Jun-14 07:58)  Authored: General Aspect/Present Illness, History and Physical Exam, Review of System, Past Medical History, Labs, EKG , Radiology, Allergies, Impression/Plan   Last Updated: 12-Jun-14 07:58 by Kathlyn Sacramento (MD)

## 2014-12-06 NOTE — H&P (Signed)
PATIENT NAME:  Dale Becker, Dale Becker MR#:  [id number] DATE OF BIRTH:  1940-10-27  DATE OF ADMISSION:  01/25/2013  PRIMARY CARE PHYSICIAN:  Juluis Pitch, MD  REFERRING PHYSICIAN:  Graciella Freer, MD  CHIEF COMPLAINT: Chest discomfort since yesterday.   HISTORY OF PRESENT ILLNESS: The patient is 74 year old African American male with a history of brain tumor status post surgery 50 years ago who presented to the ED with chest discomfort since yesterday. The patient started to have chest tightness since 12:00 p.m. yesterday, which is constant, no radiation.  In addition, the patient feels indigestion and cough. The patient denies any fever or chills. No headache or dizziness. No palpitations, orthopnea or nocturnal dyspnea. No leg edema or weight gain. The patient was noted to have elevated troponin at 17 and was started on a heparin drip and ED physician requested Dr. Fletcher Anon for cardiology consult.   PAST MEDICAL HISTORY: Brain tumor status post surgery.  SOCIAL HISTORY: Smokes 1 pack a day for 50 years.  Denies any alcohol drinking or illicit drugs.   FAMILY HISTORY: Mother had a stroke. Father had colon cancer.   ALLERGIES: No known drug allergies.  HOME MEDICATIONS:  None.  REVIEW OF SYSTEMS:   CONSTITUTIONAL: The patient denies any fever or chills. No headache or dizziness or weakness. EYES: No double vision, blurred vision.  ENT: No postnasal drip, slow speech or dysphagia.  CARDIOVASCULAR: Positive for chest pain, chest tightness. No palpitations, orthopnea or nocturnal dyspnea. No diaphoresis.  PULMONARY: Positive for cough and mild shortness of breath, but no hemoptysis and no sputum.  GASTROINTESTINAL: No abdominal pain, nausea, vomiting, or diarrhea. No melena or bloody stool.  GENITOURINARY: No dysuria, hematuria or incontinence.  SKIN: No rash or jaundice.  NEUROLOGY: No syncope, loss of consciousness or seizure.  HEMATOLOGY: No easy bruising or bleeding.  ENDOCRINE: No  polyuria, polydipsia, heat or cold intolerance.  PSYCHIATRIC: No depression or anxiety.   PHYSICAL EXAMINATION: VITAL SIGNS: Temperature 98, blood pressure 129/86, pulse 52, O2 saturation 97% in room air.  GENERAL: The patient is alert, awake and oriented, in no acute distress.  HEENT: Pupils round, equal and reactive to light and accommodation. Moist oral mucosa. Clear oropharynx.  NECK: Supple. No JVD or carotid bruits. No lymphadenopathy. No thyromegaly.  HEART:  S1 and S2 regular rate, rhythm. No murmurs or gallops.  LUNGS:  Bilateral air entry. No wheezing or rales. No use of accessory muscles to breathe.  ABDOMEN: Soft. No distention or tenderness. No organomegaly. Bowel sounds present.  EXTREMITIES: No edema, clubbing or cyanosis. No calf tenderness. Strong bilateral pedal pulses. SKIN: No rash or jaundice.  NEUROLOGIC: Alert and oriented x 3. No focal deficit. Power 5/5. Sensation intact.  LABORATORY AND DIAGNOSTICS: Chest x-ray showed mild hyperinflation which may be voluntary or could reflect underlying COPD.  No evidence of CHF or pneumonia.   Glucose 103, BUN 7, creatinine 1.1. Electrolytes are normal. CK 868, CK-MB 57.9. CBC normal. Troponin 17, INR 1.1, PTT 36.5.   EKG shows sinus bradycardia at 55 beats per minute.   IMPRESSIONS: 1.  Acute non-ST-segment elevation myocardial infarction. 2.  Bradycardia. 3.  Tobacco abuse.   PLAN OF TREATMENT: 1.  The patient will be admitted to the CCU. We will continue heparin drip.  We will start aspirin, Plavix, lisinopril and statin, but not beta blocker due to bradycardia.  2.  According to Dr. Fletcher Anon, the patient will be sent to cath lab for cardiac cath as soon as possible this  morning.  3.  Smoking cessation.  Was counseled for 5 minutes.   I discussed with Dr. Kathlyn Sacramento. Also discussed the patient's condition and plan of treatment with the patient, the patient's wife and grandson. The patient is a FULL CODE.   CRITICAL  CARE:  55 minutes.  ___________________________ Demetrios Loll, MD qc:sb D: 01/25/2013 08:05:50 ET T: 01/25/2013 08:43:53 ET JOB#: [id number]  cc: Demetrios Loll, MD, <Dictator> Demetrios Loll MD ELECTRONICALLY SIGNED 01/25/2013 14:24

## 2014-12-06 NOTE — Discharge Summary (Signed)
PATIENT NAME:  Dale Becker, Dale Becker MR#:  [id number] DATE OF BIRTH:  06-09-1941  DATE OF ADMISSION:  01/25/2013 DATE OF DISCHARGE:  01/27/2013  ADMITTING DIAGNOSES: 1.  Acute non-Q-wave myocardial infarction.  2.  Bradycardia.  3.  Tobacco abuse.   DISCHARGE DIAGNOSES: 1.  Non-ST elevation myocardial infarction. 2.  Bradycardia.  3.  Tobacco abuse.  4.  Hypertension.  5.  Hyperlipidemia, with LDL of 108.  6.  Hypomagnesemia.  7.  Hyperglycemia, with hemoglobin A1c of 5.7; no diabetes.  8.  Status post cardiac evaluation on the 12th of June 2014 by Dr. Fletcher Anon revealing global left ventricular function, hypercontractile, ejection fraction estimated was 70%. The patient's management should include aggressive medical therapy and smoking cessation, treat with Plavix for 12 months, obtain echocardiogram according to cardiologist's recommendation.   RADIOLOGIC STUDIES: Chest x-ray, PA and lateral, 12th June 2014 showed mild hyperinflation, which may be voluntary or could be reflecting the underlying COPD. No evidence of CHF or pneumonia or other acute cardiopulmonary abnormality, according to radiologist. Cardiac catheterization 12th of June 2014 done by Dr. Fletcher Anon revealed no  angiographic evidence for occlusive coronary artery disease. There is moderate disease in the mid-left circumflex and proximal RCA which were not significant but, pressure by FFR was 0.884 for the left circumflex and 0.90 for the RCA. Possible etiologies for non-STEMI include a plaque rupture with spontaneous canalization, coronary spasm. Other etiologies are less likely.   The patient is a 74 year old African American male with a history of brain tumor, status post surgery 50 years ago, presents to the hospital on the 12th of June 2014 with chest discomfort. Please refer to Dr. Lianne Moris admission note on 12 of June 2014. On arrival to the hospital to Emergency Room, the patient's temperature was 98, blood pressure 129/86, pulse was  52. O2 sats were 97% on room air.   PHYSICAL EXAM: Was unremarkable. The patient's chest x-ray revealed mild hyperinflation, but no CHF or pneumonia. The patient's EKG showed sinus brady at a rate of 55 beats per minute. No acute ST-T changes were noted. The patient's lab data done on the day of admission, 12th of June, showed elevated glucose to 103. Otherwise BMP was unremarkable. The patient's cardiac enzymes revealed CK total of 868, MB fraction was 57.9, and troponin was 17.0. The patient's CBC was unremarkable. White blood cell count 6.5, hemoglobin 15.1, and platelet count 184. Coagulation panel was unremarkable. D-dimer was normal at 0.34.   The patient was admitted to the hospital for further evaluation. He was started on Lovenox. His cardiac enzymes were cycled. Cardiology consultation with Dr. Fletcher Anon was obtained. Dr. Fletcher Anon as well as his PA, Mr. Danella Sensing, saw the patient in consultation and felt that in regards to the non-STEMI the patient's cardiac catheterization revealed no significant changes. Interventional cardiologist and regular cardiologist concluded plaque ulceration with spontaneous heparinization and coronary vasospasm. They recommended to continue aggressive medical therapy including Plavix for 12 months, continue aspirin therapy at 81 mg p.o. daily dose, also statin, nitroglycerin sublingually as needed, low dose of a beta blocker such as Lopressor  12.5 mg p.o. twice daily dose. They recommended also to add Norvasc if needed for blood pressure control, as well as stressed tobacco cessation and add Imdur needed, and discharge patient in 48 hours and ambulate him prior to discharge.   The patient did well after cardiac catheterization. He is to continue medications, his simvastatin 40 mg p.o. daily dose, aspirin 81 mg per daily dose,  Plavix 75 mg per daily dose, and metoprolol at 12.5 mg twice daily dose. He is to continue low-fat, low-cholesterol diet and follow up with Dr.  Fletcher Anon in approximately 1 week after discharge.   In regards to bradycardia, the patient's bradycardia resolved. As time progressed he was started on metoprolol with good heart rate control. On the day of discharge the patient's vital signs, temperature was 99.5, pulse was ranging from 56 to 60, respiration rate was 16 to 28, blood pressure at 139/78, saturation was 92 to 94% on room air at rest.   In regard to tobacco abuse the patient was counseled extensively. He is to continue nicotine replacement therapy, which was prescribed for him upon discharge, and a transdermal patch as well as inhalation device. The patient's discharge medications are as follows:   Nitroglycerin 0.4 mg p.o. every 5 minutes as needed, simvastatin 40 mg p.o. at bedtime, aspirin 81 mg once daily, Plavix 75 mg p.o. once daily, nicotine transdermal patch 14 mg daily topically, lisinopril 5 mg p.o. daily, metoprolol 12.5 mg p.o. twice daily, and Nicotrol oral inhaler 10 mg every 2 hours as needed.   His diet will be low-fat, low-cholesterol.   ACTIVITY LIMITATIONS: As tolerated.   Followup appointment with Dr. Fletcher Anon in 1 week after discharge.   No other radiologic studies were performed     ____________________________ Theodoro Grist, MD rv:dm D: 01/27/2013 14:12:40 ET T: 01/28/2013 12:35:20 ET JOB#: [id number]  cc: Theodoro Grist, MD, <Dictator> Muhammad A. Fletcher Anon, MD Theodoro Grist MD ELECTRONICALLY SIGNED 02/19/2013 12:23

## 2014-12-16 ENCOUNTER — Other Ambulatory Visit: Payer: Self-pay | Admitting: *Deleted

## 2014-12-16 MED ORDER — LISINOPRIL 10 MG PO TABS: 10.0000 mg | ORAL_TABLET | Freq: Every day | ORAL | Status: DC

## 2014-12-17 ENCOUNTER — Telehealth: Payer: Self-pay

## 2014-12-17 ENCOUNTER — Other Ambulatory Visit: Payer: Self-pay

## 2014-12-17 MED ORDER — LISINOPRIL 10 MG PO TABS: 10.0000 mg | ORAL_TABLET | Freq: Every day | ORAL | Status: DC

## 2014-12-17 NOTE — Telephone Encounter (Signed)
Refill sent for Lisinopril.

## 2014-12-17 NOTE — Telephone Encounter (Signed)
L MOM for pt to schedule f/u (6 months) in April.

## 2015-01-09 ENCOUNTER — Ambulatory Visit (INDEPENDENT_AMBULATORY_CARE_PROVIDER_SITE_OTHER): Payer: Managed Care, Other (non HMO) | Admitting: Cardiovascular Disease

## 2015-01-09 ENCOUNTER — Encounter: Payer: Self-pay | Admitting: Cardiovascular Disease

## 2015-01-09 VITALS — BP 132/72 | HR 49 | Ht 73.0 in | Wt 165.0 lb

## 2015-01-09 DIAGNOSIS — I10 Essential (primary) hypertension: Secondary | ICD-10-CM | POA: Diagnosis not present

## 2015-01-09 DIAGNOSIS — M79604 Pain in right leg: Secondary | ICD-10-CM | POA: Insufficient documentation

## 2015-01-09 DIAGNOSIS — I2510 Atherosclerotic heart disease of native coronary artery without angina pectoris: Secondary | ICD-10-CM | POA: Diagnosis not present

## 2015-01-09 DIAGNOSIS — E785 Hyperlipidemia, unspecified: Secondary | ICD-10-CM

## 2015-01-09 DIAGNOSIS — Z720 Tobacco use: Secondary | ICD-10-CM

## 2015-01-09 NOTE — Patient Instructions (Signed)
Medication Instructions:  Your physician recommends that you continue on your current medications as directed. Please refer to the Current Medication list given to you today.   Labwork: None  Testing/Procedures: Your physician has requested that you have a lower extremity arterial duplex. This test is an ultrasound of the arteries in the legs. It looks at arterial blood flow in the legs. Allow one hour for Lower and Upper Arterial scans. There are no restrictions or special instructions   Follow-Up: Your physician wants you to follow-up in: [place] with Dr. Fletcher Anon.  You will receive a reminder letter in the mail two months in advance. If you don't receive a letter, please call our office to schedule the follow-up appointment.   Any Other Special Instructions Will Be Listed Below (If Applicable).

## 2015-01-09 NOTE — Assessment & Plan Note (Signed)
Blood pressure is controlled on lisinopril.

## 2015-01-09 NOTE — Assessment & Plan Note (Signed)
He continues to do well and denies any anginal symptoms. Continue medical therapy. He has chronic sinus bradycardia but seems to be asymptomatic.

## 2015-01-09 NOTE — Assessment & Plan Note (Signed)
I again discussed with him the importance of smoking cessation. 

## 2015-01-09 NOTE — Progress Notes (Signed)
HPI  This is a 74 year old [place] American man who is here today for a followup visit regarding coronary artery disease. He presented in 01/2013 to [facility] with non-ST elevation myocardial infarction. EKG showed no acute changes. Peak troponin was 22. Echocardiogram showed normal LV and RV systolic function without significant valvular abnormalities. He underwent urgent cardiac catheterization which showed moderate disease in the mid left circumflex and proximal RCA. Pressure wire interrogation was performed on both and was not obstructive. The left circumflex appeared slightly hazy and possibly the site of a plaque rupture . No other etiology was found for his elevated troponin. D-dimer was normal and there was no strong clinical suspicion for pulmonary embolism. The patient was treated medically with no recurrent symptoms.  Since then, he had no recurrent cardiac events. He denies any chest pain or shortness of breath. Unfortunately, he continues to smoke. He did not tolerate atorvastatin, simvastatin or rosuvastatin due to myalgia. I switched him to pravastatin and he seems to be tolerating this. He complains of right leg pain after he does his work which is described as aching sensation that starts from the hip and goes all the way down. This does not happen all the time when he walks. He has no discomfort on the left side.  No Known Allergies   Current Outpatient Prescriptions on File Prior to Visit  Medication Sig Dispense Refill  . aspirin 81 MG tablet Take 81 mg by mouth daily.    Marland Kitchen lisinopril (PRINIVIL,ZESTRIL) 10 MG tablet Take 1 tablet (10 mg total) by mouth daily. 90 tablet 3  . NITROSTAT 0.4 MG SL tablet Place 0.4 mg under the tongue every 5 (five) minutes as needed.     . pravastatin (PRAVACHOL) 40 MG tablet Take 1 tablet (40 mg total) by mouth every evening. 30 tablet 6   No current facility-administered medications on file prior to visit.     Past Medical History  Diagnosis  Date  . Brain tumor   . MI (myocardial infarction)   . Coronary artery disease 01/2013    Presented in June of 2014 with non-ST elevation myocardial infarction(peak troponin of 22). Cardiac catheterization showed moderate nonobstructive disease in the left circumflex and proximal RCA with an FFR ratio of 0.88 and 0.90 respectively. Ejection fraction was 70% and hyperdynamic. MI was likely due to plaque rupture without obstructive disease.   . Tobacco use   . Hypertension   . Hyperlipidemia      Past Surgical History  Procedure Laterality Date  . Brain tumor excision    . Cardiac catheterization  01/2013    [facility];EF 70 %     Family History  Problem Relation Age of Onset  . Family history unknown: Yes     History   Social History  . Marital Status: Married    Spouse Name: N/A  . Number of Children: N/A  . Years of Education: N/A   Occupational History  . Not on file.   Social History Main Topics  . Smoking status: Current Every Day Smoker -- 1.00 packs/day for 50 years    Types: Cigarettes  . Smokeless tobacco: Not on file  . Alcohol Use: No  . Drug Use: No  . Sexual Activity: Not on file   Other Topics Concern  . Not on file   Social History Narrative     PHYSICAL EXAM   BP 132/72 mmHg  Pulse 49  Ht 6\' 1"  (1.854 m)  Wt 165 lb (  74.844 kg)  BMI 21.77 kg/m2 Constitutional: He is oriented to person, place, and time. He appears well-developed and well-nourished. No distress.  HENT: No nasal discharge.  Head: Normocephalic and atraumatic.  Eyes: Pupils are equal and round. Right eye exhibits no discharge. Left eye exhibits no discharge.  Neck: Normal range of motion. Neck supple. No JVD present. No thyromegaly present.  Cardiovascular: Bradycardic, regular rhythm, normal heart sounds and. Exam reveals no gallop and no friction rub. No murmur heard.  Pulmonary/Chest: Effort normal and breath sounds normal. No stridor. No respiratory distress. He has no wheezes.  He has no rales. He exhibits no tenderness.  Abdominal: Soft. Bowel sounds are normal. He exhibits no distension. There is no tenderness. There is no rebound and no guarding.  Musculoskeletal: Normal range of motion. He exhibits no edema and no tenderness.  Neurological: He is alert and oriented to person, place, and time. Coordination normal.  Skin: Skin is warm and dry. No rash noted. He is not diaphoretic. No erythema. No pallor.  Psychiatric: He has a normal mood and affect. His behavior is normal. Judgment and thought content normal.  He has diminished distal pulses.   EKG: Sinus  Bradycardia  WITHIN NORMAL LIMITS   ASSESSMENT AND PLAN

## 2015-01-09 NOTE — Assessment & Plan Note (Signed)
This could be atypical claudication with prolonged history of tobacco use. He had diminished distal pulses. I requested lower extremity arterial Doppler.

## 2015-01-09 NOTE — Assessment & Plan Note (Signed)
He seems to be tolerating pravastatin.

## 2015-01-15 ENCOUNTER — Other Ambulatory Visit: Payer: Self-pay | Admitting: Cardiovascular Disease

## 2015-01-15 DIAGNOSIS — R0989 Other specified symptoms and signs involving the circulatory and respiratory systems: Secondary | ICD-10-CM

## 2015-01-15 DIAGNOSIS — I739 Peripheral vascular disease, unspecified: Secondary | ICD-10-CM

## 2015-01-28 ENCOUNTER — Ambulatory Visit (INDEPENDENT_AMBULATORY_CARE_PROVIDER_SITE_OTHER): Payer: Managed Care, Other (non HMO)

## 2015-01-28 DIAGNOSIS — I739 Peripheral vascular disease, unspecified: Secondary | ICD-10-CM | POA: Diagnosis not present

## 2015-01-28 DIAGNOSIS — R0989 Other specified symptoms and signs involving the circulatory and respiratory systems: Secondary | ICD-10-CM

## 2015-02-12 ENCOUNTER — Other Ambulatory Visit: Payer: Self-pay | Admitting: Cardiovascular Disease

## 2015-02-12 DIAGNOSIS — I739 Peripheral vascular disease, unspecified: Secondary | ICD-10-CM

## 2015-02-20 ENCOUNTER — Ambulatory Visit (INDEPENDENT_AMBULATORY_CARE_PROVIDER_SITE_OTHER): Payer: Managed Care, Other (non HMO)

## 2015-02-20 DIAGNOSIS — I739 Peripheral vascular disease, unspecified: Secondary | ICD-10-CM

## 2015-05-17 ENCOUNTER — Encounter: Payer: Self-pay | Admitting: Emergency Medicine

## 2015-05-17 ENCOUNTER — Emergency Department: Payer: Managed Care, Other (non HMO)

## 2015-05-17 ENCOUNTER — Emergency Department
Admission: EM | Admit: 2015-05-17 | Discharge: 2015-05-17 | Disposition: A | Discharge status: Home / Self Care | Payer: Managed Care, Other (non HMO) | Attending: Emergency Medicine | Admitting: Emergency Medicine

## 2015-05-17 DIAGNOSIS — H538 Other visual disturbances: Secondary | ICD-10-CM | POA: Diagnosis not present

## 2015-05-17 DIAGNOSIS — Z720 Tobacco use: Secondary | ICD-10-CM | POA: Diagnosis not present

## 2015-05-17 DIAGNOSIS — I10 Essential (primary) hypertension: Secondary | ICD-10-CM | POA: Diagnosis not present

## 2015-05-17 DIAGNOSIS — H579 Unspecified disorder of eye and adnexa: Secondary | ICD-10-CM | POA: Diagnosis present

## 2015-05-17 LAB — COMPREHENSIVE METABOLIC PANEL
ALT: 13 U/L — ABNORMAL LOW (ref 17–63)
AST: 19 U/L (ref 15–41)
Albumin: 4.4 g/dL (ref 3.5–5.0)
Alkaline Phosphatase: 60 U/L (ref 38–126)
Anion gap: 5 (ref 5–15)
BUN: 9 mg/dL (ref 6–20)
CHLORIDE: 105 mmol/L (ref 101–111)
CO2: 29 mmol/L (ref 22–32)
CREATININE: 1.07 mg/dL (ref 0.61–1.24)
Calcium: 9.4 mg/dL (ref 8.9–10.3)
GFR calc non Af Amer: >60 mL/min (ref >60)
Glucose, Bld: 77 mg/dL (ref 65–99)
POTASSIUM: 3.7 mmol/L (ref 3.5–5.1)
Sodium: 139 mmol/L (ref 135–145)
Total Bilirubin: 0.7 mg/dL (ref 0.3–1.2)
Total Protein: 8.1 g/dL (ref 6.5–8.1)

## 2015-05-17 LAB — CBC
HCT: 47.2 % (ref 40.0–52.0)
Hemoglobin: 15.6 g/dL (ref 13.0–18.0)
MCH: 31 pg (ref 26.0–34.0)
MCHC: 33 g/dL (ref 32.0–36.0)
MCV: 93.7 fL (ref 80.0–100.0)
PLATELETS: 160 10*3/uL (ref 150–440)
RBC: 5.03 MIL/uL (ref 4.40–5.90)
RDW: 14.1 % (ref 11.5–14.5)
WBC: 3.4 10*3/uL — ABNORMAL LOW (ref 3.8–10.6)

## 2015-05-17 NOTE — ED Notes (Signed)
Patient and wife verbalized understanding of need to follow up with opthalmology.

## 2015-05-17 NOTE — ED Notes (Addendum)
Pt states vision in right eye seems dim "like a dark shell over it," comes and goes, does not obscure vision completely. Pt states it can sometimes be cleared by looking around. Denies pain but states fuzziness is "annoying."

## 2015-05-17 NOTE — ED Provider Notes (Signed)
[facility] Emergency Department Provider Note     Time seen: ----------------------------------------- 9:58 AM on 05/17/2015 -----------------------------------------    I have reviewed the triage vital signs and the nursing notes.   HISTORY  Chief Complaint Eye Problem    HPI Dale Becker is a 74 y.o. male who presents ER with vision problems for last several weeks. Patient states right eye seems down like is a dark shell over, comes and goes. He does not have any obscured vision completely. Patient states she seen an eye doctor years ago, denies any other neurologic symptoms.   Past Medical History  Diagnosis Date  . Brain tumor ([place])   . MI (myocardial infarction) ([place])   . Coronary artery disease 01/2013    Presented in June of 2014 with non-ST elevation myocardial infarction(peak troponin of 22). Cardiac catheterization showed moderate nonobstructive disease in the left circumflex and proximal RCA with an FFR ratio of 0.88 and 0.90 respectively. Ejection fraction was 70% and hyperdynamic. MI was likely due to plaque rupture without obstructive disease.   . Tobacco use   . Hypertension   . Hyperlipidemia     Patient Active Problem List   Diagnosis Date Noted  . Right leg pain 01/09/2015  . Tobacco use   . Hypertension   . Hyperlipidemia   . Coronary artery disease 01/14/2013    Past Surgical History  Procedure Laterality Date  . Brain tumor excision    . Cardiac catheterization  01/2013    [facility];EF 70 %    Allergies Review of patient's allergies indicates no known allergies.  Social History Social History  Substance Use Topics  . Smoking status: Current Every Day Smoker -- 1.00 packs/day for 50 years    Types: Cigarettes  . Smokeless tobacco: None  . Alcohol Use: No    Review of Systems Constitutional: Negative for fever. Eyes: Positive for vision changes, particularly in the right ENT: Negative for sore  throat. Cardiovascular: Negative for chest pain. Respiratory: Negative for shortness of breath. Gastrointestinal: Negative for abdominal pain, vomiting and diarrhea. Genitourinary: Negative for dysuria. Musculoskeletal: Negative for back pain. Skin: Negative for rash. Neurological: Negative for headaches, focal weakness or numbness.  10-point ROS otherwise negative.  ____________________________________________   PHYSICAL EXAM:  VITAL SIGNS: ED Triage Vitals  Enc Vitals Group     BP 05/17/15 0928 142/76 mmHg     Pulse Rate 05/17/15 0928 62     Resp 05/17/15 0928 18     Temp 05/17/15 0928 97.4 F (36.3 C)     Temp src --      SpO2 05/17/15 0928 100 %     Weight 05/17/15 0928 160 lb (72.576 kg)     Height 05/17/15 0928 6\' 1"  (1.854 m)     Head Cir --      Peak Flow --      Pain Score 05/17/15 0916 0     Pain Loc --      Pain Edu? --      Excl. in [place]? --     Constitutional: Alert and oriented. Well appearing and in no distress. Eyes: Conjunctivae are normal. PERRL. Normal extraocular movements. Funduscopic exam is difficult. There are no visual field deficits. ENT   Head: Normocephalic and atraumatic.   Nose: No congestion/rhinnorhea.   Mouth/Throat: Mucous membranes are moist.   Neck: No stridor. Cardiovascular: Normal rate, regular rhythm. Normal and symmetric distal pulses are present in all extremities. No murmurs, rubs, or gallops. Respiratory: Normal  respiratory effort without tachypnea nor retractions. Breath sounds are clear and equal bilaterally. No wheezes/rales/rhonchi. Gastrointestinal: Soft and nontender. No distention. No abdominal bruits.  Musculoskeletal: Nontender with normal range of motion in all extremities. No joint effusions.  No lower extremity tenderness nor edema. Neurologic:  Normal speech and language. No gross focal neurologic deficits are appreciated. Speech is normal. No gait instability. Skin:  Skin is warm, dry and intact. No  rash noted. Psychiatric: Mood and affect are normal. Speech and behavior are normal. Patient exhibits appropriate insight and judgment.  ____________________________________________  ED COURSE:  Pertinent labs & imaging results that were available during my care of the patient were reviewed by me and considered in my medical decision making (see chart for details). Patient with 20/70 vision in the left eye and 20/100 vision in the right eye. Patient has no visual field deficits, it appears to be more that the patient is likely developing cataracts. 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 No acute infarct evident. There is atrophy with patchy periventricular small vessel disease. No mass or hemorrhage. ____________________________________________  FINAL ASSESSMENT AND PLAN  Blurry vision  Plan: Patient with labs and imaging as dictated above. Patient with progressive decreased visual acuity describes as blurry vision. No visual field deficits or signs of stroke. He has normal extraocular movement. He'll be referred to ophthalmology for follow-up.   Earleen Newport, MD   Earleen Newport, MD 05/17/15 [phone]

## 2015-05-17 NOTE — ED Notes (Signed)
MD at bedside to discuss plan for discharge.  Will continue to monitor.

## 2015-05-17 NOTE — Discharge Instructions (Signed)
Blurred Vision °You have been seen today complaining of blurred vision. This means you have a loss of ability to see small details.  °CAUSES  °Blurred vision can be a symptom of underlying eye problems, such as: °· Aging of the eye (presbyopia). °· Glaucoma. °· Cataracts. °· Eye infection. °· Eye-related migraine. °· Diabetes mellitus. °· Fatigue. °· Migraine headaches. °· High blood pressure. °· Breakdown of the back of the eye (macular degeneration). °· Problems caused by some medications. °The most common cause of blurred vision is the need for eyeglasses or a new prescription. Today in the emergency department, no cause for your blurred vision can be found. °SYMPTOMS  °Blurred vision is the loss of visual sharpness and detail (acuity). °DIAGNOSIS  °Should blurred vision continue, you should see your caregiver. If your caregiver is your primary care physician, he or she may choose to refer you to another specialist.  °TREATMENT  °Do not ignore your blurred vision. Make sure to have it checked out to see if further treatment or referral is necessary. °SEEK MEDICAL CARE IF:  °You are unable to get into a specialist so we can help you with a referral. °SEEK IMMEDIATE MEDICAL CARE IF: °You have severe eye pain, severe headache, or sudden loss of vision. °MAKE SURE YOU:  °· Understand these instructions. °· Will watch your condition. °· Will get help right away if you are not doing well or get worse. °Document Released: 08/05/2003 Document Revised: 10/25/2011 Document Reviewed: 03/06/2008 °Exit[employer]® Patient Information ©2015 [employer]. This information is not intended to replace advice given to you by your health care provider. Make sure you discuss any questions you have with your health care provider. ° °

## 2015-05-17 NOTE — ED Notes (Signed)
Pt to ed with c/o peripheral vision decreased to right eye x 2 weeks.  Pt states occasional pain but not constant.  Denies headache, weakness and dizziness.

## 2015-05-23 ENCOUNTER — Other Ambulatory Visit: Payer: Self-pay | Admitting: Optometry

## 2015-05-23 ENCOUNTER — Other Ambulatory Visit
Admission: RE | Admit: 2015-05-23 | Discharge: 2015-05-23 | Disposition: A | Discharge status: Home / Self Care | Payer: Managed Care, Other (non HMO) | Source: Ambulatory Visit | Attending: Ophthalmology | Admitting: Ophthalmology

## 2015-05-23 ENCOUNTER — Ambulatory Visit
Admission: RE | Admit: 2015-05-23 | Discharge: 2015-05-23 | Disposition: A | Discharge status: Home / Self Care | Payer: Managed Care, Other (non HMO) | Source: Ambulatory Visit | Attending: Optometry | Admitting: Optometry

## 2015-05-23 DIAGNOSIS — H209 Unspecified iridocyclitis: Secondary | ICD-10-CM | POA: Diagnosis not present

## 2015-05-23 DIAGNOSIS — H3090 Unspecified chorioretinal inflammation, unspecified eye: Secondary | ICD-10-CM

## 2015-05-23 DIAGNOSIS — D869 Sarcoidosis, unspecified: Secondary | ICD-10-CM | POA: Insufficient documentation

## 2015-05-24 LAB — TOXOPLASMA GONDII ANTIBODY, IGM

## 2015-05-24 LAB — RPR: RPR Ser Ql: NONREACTIVE

## 2015-05-24 LAB — ANGIOTENSIN CONVERTING ENZYME: Angiotensin-Converting Enzyme: <15 U/L (ref 14–82)

## 2015-05-24 LAB — HIV ANTIBODY (ROUTINE TESTING W REFLEX): HIV SCREEN 4TH GENERATION: NONREACTIVE

## 2015-05-24 LAB — INFECT DISEASE AB IGM REFLEX 1

## 2015-06-24 ENCOUNTER — Other Ambulatory Visit: Payer: Self-pay | Admitting: Cardiovascular Disease

## 2015-06-26 ENCOUNTER — Other Ambulatory Visit: Payer: Self-pay | Admitting: *Deleted

## 2015-06-26 ENCOUNTER — Telehealth: Payer: Self-pay | Admitting: Cardiovascular Disease

## 2015-06-26 MED ORDER — PRAVASTATIN SODIUM 40 MG PO TABS: 40.0000 mg | ORAL_TABLET | Freq: Every evening | ORAL | Status: DC

## 2015-06-26 NOTE — Telephone Encounter (Signed)
°*  STAT* If patient is at the pharmacy, call can be transferred to refill team.   1. Which medications need to be refilled? (please list name of each medication and dose if known)  PRAVASTATIN 40 mg po q evening   2. Which pharmacy/location (including street and city if local pharmacy) is medication to be sent to?     3. Do they need a 30 day or 90 day supply? 30  Patient is out of medication and does not have tonight's dose

## 2015-06-27 ENCOUNTER — Other Ambulatory Visit: Payer: Self-pay

## 2015-08-12 ENCOUNTER — Ambulatory Visit (INDEPENDENT_AMBULATORY_CARE_PROVIDER_SITE_OTHER): Payer: Managed Care, Other (non HMO) | Admitting: Cardiovascular Disease

## 2015-08-12 ENCOUNTER — Encounter: Payer: Self-pay | Admitting: Cardiovascular Disease

## 2015-08-12 VITALS — BP 148/94 | HR 81 | Ht 73.0 in | Wt 168.2 lb

## 2015-08-12 DIAGNOSIS — E785 Hyperlipidemia, unspecified: Secondary | ICD-10-CM

## 2015-08-12 DIAGNOSIS — I10 Essential (primary) hypertension: Secondary | ICD-10-CM | POA: Diagnosis not present

## 2015-08-12 DIAGNOSIS — I2510 Atherosclerotic heart disease of native coronary artery without angina pectoris: Secondary | ICD-10-CM | POA: Diagnosis not present

## 2015-08-12 DIAGNOSIS — Z720 Tobacco use: Secondary | ICD-10-CM | POA: Diagnosis not present

## 2015-08-12 MED ORDER — LISINOPRIL 20 MG PO TABS: 20.0000 mg | ORAL_TABLET | Freq: Every day | ORAL | Status: DC

## 2015-08-12 NOTE — Patient Instructions (Signed)
Medication Instructions:  Your physician has recommended you make the following change in your medication:  INCREASE lisinopril to 20mg  daily   Labwork: none  Testing/Procedures: none  Follow-Up: Your physician wants you to follow-up in: six months with Dr. Fletcher Anon.  You will receive a reminder letter in the mail two months in advance. If you don't receive a letter, please call our office to schedule the follow-up appointment.   Any Other Special Instructions Will Be Listed Below (If Applicable).     If you need a refill on your cardiac medications before your next appointment, please call your pharmacy.

## 2015-08-12 NOTE — Assessment & Plan Note (Signed)
Blood pressure continues to be elevated. Thus, I increased the dose of lisinopril to 20 mg once daily.

## 2015-08-12 NOTE — Assessment & Plan Note (Signed)
He is doing reasonably well with no symptoms suggestive of angina. Continue medical therapy. 

## 2015-08-12 NOTE — Assessment & Plan Note (Signed)
He is intolerant to most statins but have been tolerating pravastatin 40 mg daily.

## 2015-08-12 NOTE — Assessment & Plan Note (Signed)
I again discussed with him the importance of smoking cessation. 

## 2015-08-12 NOTE — Progress Notes (Signed)
HPI  This is a 74 year old [place] American man who is here today for a followup visit regarding coronary artery disease. He presented in 01/2013 to [facility] with non-ST elevation myocardial infarction. EKG showed no acute changes. Peak troponin was 22. Echocardiogram showed normal LV and RV systolic function without significant valvular abnormalities. He underwent urgent cardiac catheterization which showed moderate disease in the mid left circumflex and proximal RCA. Pressure wire interrogation was performed on both and was not obstructive. The left circumflex appeared slightly hazy and possibly the site of a plaque rupture . No other etiology was found for his elevated troponin. D-dimer was normal and there was no strong clinical suspicion for pulmonary embolism. The patient was treated medically with no recurrent symptoms.  Since then, he had no recurrent cardiac events. He denies any chest pain or shortness of breath. Unfortunately, he continues to smoke. He did not tolerate atorvastatin, simvastatin or rosuvastatin due to myalgia. I switched him to pravastatin and he seems to be tolerating this.  He complained of right leg pain during last visit. I ordered an ABI which was normal and duplex showed diffuse nonobstructive disease.    No Known Allergies   Current Outpatient Prescriptions on File Prior to Visit  Medication Sig Dispense Refill  . aspirin 81 MG tablet Take 81 mg by mouth daily.    Marland Kitchen lisinopril (PRINIVIL,ZESTRIL) 10 MG tablet Take 1 tablet (10 mg total) by mouth daily. 90 tablet 3  . NITROSTAT 0.4 MG SL tablet Place 0.4 mg under the tongue every 5 (five) minutes as needed.     . pravastatin (PRAVACHOL) 40 MG tablet Take 1 tablet (40 mg total) by mouth every evening. 30 tablet 3   No current facility-administered medications on file prior to visit.     Past Medical History  Diagnosis Date  . Brain tumor ([place])   . MI (myocardial infarction) ([place])   . Coronary artery disease  01/2013    Presented in June of 2014 with non-ST elevation myocardial infarction(peak troponin of 22). Cardiac catheterization showed moderate nonobstructive disease in the left circumflex and proximal RCA with an FFR ratio of 0.88 and 0.90 respectively. Ejection fraction was 70% and hyperdynamic. MI was likely due to plaque rupture without obstructive disease.   . Tobacco use   . Hypertension   . Hyperlipidemia      Past Surgical History  Procedure Laterality Date  . Brain tumor excision    . Cardiac catheterization  01/2013    [facility];EF 70 %     Family History  Problem Relation Age of Onset  . Family history unknown: Yes     Social History   Social History  . Marital Status: Married    Spouse Name: N/A  . Number of Children: N/A  . Years of Education: N/A   Occupational History  . Not on file.   Social History Main Topics  . Smoking status: Current Every Day Smoker -- 1.00 packs/day for 50 years    Types: Cigarettes  . Smokeless tobacco: Not on file  . Alcohol Use: No  . Drug Use: No  . Sexual Activity: Not on file   Other Topics Concern  . Not on file   Social History Narrative     PHYSICAL EXAM   BP 148/94 mmHg  Pulse 81  Ht 6\' 1"  (1.854 m)  Wt 168 lb 4 oz (76.318 kg)  BMI 22.20 kg/m2 Constitutional: He is oriented to person, place, and time. He  appears well-developed and well-nourished. No distress.  HENT: No nasal discharge.  Head: Normocephalic and atraumatic.  Eyes: Pupils are equal and round. Right eye exhibits no discharge. Left eye exhibits no discharge.  Neck: Normal range of motion. Neck supple. No JVD present. No thyromegaly present.  Cardiovascular: Bradycardic, regular rhythm, normal heart sounds and. Exam reveals no gallop and no friction rub. No murmur heard.  Pulmonary/Chest: Effort normal and breath sounds normal. No stridor. No respiratory distress. He has no wheezes. He has no rales. He exhibits no tenderness.  Abdominal: Soft. Bowel  sounds are normal. He exhibits no distension. There is no tenderness. There is no rebound and no guarding.  Musculoskeletal: Normal range of motion. He exhibits no edema and no tenderness.  Neurological: He is alert and oriented to person, place, and time. Coordination normal.  Skin: Skin is warm and dry. No rash noted. He is not diaphoretic. No erythema. No pallor.  Psychiatric: He has a normal mood and affect. His behavior is normal. Judgment and thought content normal.  He has diminished distal pulses.   EKG: Normal sinus rhythm  WITHIN NORMAL LIMITS   ASSESSMENT AND PLAN

## 2016-01-26 ENCOUNTER — Ambulatory Visit (INDEPENDENT_AMBULATORY_CARE_PROVIDER_SITE_OTHER): Payer: Managed Care, Other (non HMO) | Admitting: Cardiovascular Disease

## 2016-01-26 ENCOUNTER — Encounter: Payer: Self-pay | Admitting: Cardiovascular Disease

## 2016-01-26 VITALS — BP 130/86 | HR 59 | Ht 73.0 in | Wt 170.5 lb

## 2016-01-26 DIAGNOSIS — I2510 Atherosclerotic heart disease of native coronary artery without angina pectoris: Secondary | ICD-10-CM

## 2016-01-26 DIAGNOSIS — I10 Essential (primary) hypertension: Secondary | ICD-10-CM | POA: Diagnosis not present

## 2016-01-26 MED ORDER — LISINOPRIL 10 MG PO TABS: 10.0000 mg | ORAL_TABLET | Freq: Every day | ORAL | Status: DC

## 2016-01-26 MED ORDER — PRAVASTATIN SODIUM 20 MG PO TABS: 20.0000 mg | ORAL_TABLET | Freq: Every evening | ORAL | Status: DC

## 2016-01-26 NOTE — Progress Notes (Signed)
Cardiology Office Note   Date:  01/26/2016   ID:  Dale Becker, DOB Aug 19, 1940, MRN [id number]  PCP:  Juluis Pitch, MD  Cardiologist:   Kathlyn Sacramento, MD   Chief Complaint  Patient presents with  . other    6 month follow up.       History of Present Illness: Dale Becker is a 75 y.o. male who presents for  a followup visit regarding coronary artery disease. He presented in 01/2013 to [facility] with non-ST elevation myocardial infarction. EKG showed no acute changes. Peak troponin was 22. Echocardiogram showed normal LV and RV systolic function without significant valvular abnormalities. He underwent urgent cardiac catheterization which showed moderate disease in the mid left circumflex and proximal RCA. Pressure wire interrogation was performed on both and was not obstructive. The left circumflex appeared slightly hazy and possibly the site of a plaque rupture . No other etiology was found for his elevated troponin. D-dimer was normal and there was no strong clinical suspicion for pulmonary embolism. The patient was treated medically with no recurrent symptoms.  Since then, he had no recurrent cardiac events. He denies any chest pain or shortness of breath. Unfortunately, he continues to smoke. He did not tolerate atorvastatin, simvastatin or rosuvastatin due to myalgia. He Was started on pravastatin but he has not been taking it regularly due to myalgia.Marland Kitchen  ABI in June 2016 was normal and duplex showed diffuse nonobstructive disease. During last visit, I increased the dose of Lisinopril. However, he reports increased dizziness and fatigue and he wants to go back on the smaller dose.   Past Medical History  Diagnosis Date  . Brain tumor ([place])   . MI (myocardial infarction) ([place])   . Coronary artery disease 01/2013    Presented in June of 2014 with non-ST elevation myocardial infarction(peak troponin of 22). Cardiac catheterization showed moderate nonobstructive disease in the  left circumflex and proximal RCA with an FFR ratio of 0.88 and 0.90 respectively. Ejection fraction was 70% and hyperdynamic. MI was likely due to plaque rupture without obstructive disease.   . Tobacco use   . Hypertension   . Hyperlipidemia     Past Surgical History  Procedure Laterality Date  . Brain tumor excision    . Cardiac catheterization  01/2013    [facility];EF 70 %     Current Outpatient Prescriptions  Medication Sig Dispense Refill  . aspirin 81 MG tablet Take 81 mg by mouth daily.    Marland Kitchen lisinopril (PRINIVIL,ZESTRIL) 20 MG tablet Take 1 tablet (20 mg total) by mouth daily. 30 tablet 5  . NITROSTAT 0.4 MG SL tablet Place 0.4 mg under the tongue every 5 (five) minutes as needed.     . pravastatin (PRAVACHOL) 40 MG tablet Take 1 tablet (40 mg total) by mouth every evening. 30 tablet 3   No current facility-administered medications for this visit.    Allergies:   Review of patient's allergies indicates no known allergies.    Social History:  The patient  reports that he has been smoking Cigarettes.  He has a 50 pack-year smoking history. He does not have any smokeless tobacco history on file. He reports that he does not drink alcohol or use illicit drugs.   Family History:  The patient's Family history is unknown by patient.    ROS:  Please see the history of present illness.   Otherwise, review of systems are positive for none.   All other systems are reviewed and negative.  PHYSICAL EXAM: VS:  There were no vitals taken for this visit. , BMI There is no weight on file to calculate BMI. GEN: Well nourished, well developed, in no acute distress HEENT: normal Neck: no JVD, carotid bruits, or masses Cardiac: RRR; no murmurs, rubs, or gallops,no edema  Respiratory:  clear to auscultation bilaterally, normal work of breathing GI: soft, nontender, nondistended, + BS MS: no deformity or atrophy Skin: warm and dry, no rash Neuro:  Strength and sensation are intact Psych:  euthymic mood, full affect   EKG:  EKG is ordered today. The ekg ordered today demonstrates normal sinus rhythm with no significant ST or T wave changes.   Recent Labs: 05/17/2015: ALT 13*; BUN 9; Creatinine, Ser 1.07; Hemoglobin 15.6; Platelets 160; Potassium 3.7; Sodium 139    Lipid Panel    Component Value Date/Time   CHOL 130 07/09/2014 0912   CHOL 162 01/26/2013 0402   TRIG 85 07/09/2014 0912   TRIG 76 01/26/2013 0402   HDL 31* 07/09/2014 0912   HDL 39* 01/26/2013 0402   CHOLHDL 4.2 07/09/2014 0912   VLDL 15 01/26/2013 0402   LDLCALC 82 07/09/2014 0912   LDLCALC 108* 01/26/2013 0402      Wt Readings from Last 3 Encounters:  08/12/15 168 lb 4 oz (76.318 kg)  05/17/15 160 lb (72.576 kg)  01/09/15 165 lb (74.844 kg)         ASSESSMENT AND PLAN:  1.  Coronary artery disease involving native coronary arteries without angina:He is doing well from a cardiac standpoint with no anginal symptoms. Unfortunately, he is not very compliant with his medications and I discussed with him the importance of improving his lifestyle and taking medications regularly.  2. Hyperlipidemia: He has not been taking pravastatin on a regular basis due to myalgia. I decreased the dose to 20 mg once daily. He will need a follow-up lipid and liver profile once he is consistent in taking this medication.  3. Essential hypertension: Blood pressure is controlled but he wants to cut down the dose of Lisinopril back to 10 mg daily due to some side effects. This was done today.  4. Tobacco use: I again discussed with him the importance of smoking cessation.     Disposition:   FU with me in 6 months  Signed,  Kathlyn Sacramento, MD  01/26/2016 11:14 AM    [place]

## 2016-01-26 NOTE — Patient Instructions (Signed)
Medication Instructions:  Your physician has recommended you make the following change in your medication:  DECREASE lisinopril to 10mg  once a day DECREASE pravastatin 20 mg once a day   Labwork: none  Testing/Procedures: none  Follow-Up: Your physician wants you to follow-up in: six months with Dr. Fletcher Anon.  You will receive a reminder letter in the mail two months in advance. If you don't receive a letter, please call our office to schedule the follow-up appointment.   Any Other Special Instructions Will Be Listed Below (If Applicable).     If you need a refill on your cardiac medications before your next appointment, please call your pharmacy.

## 2016-01-31 IMAGING — CR DG CHEST 2V
1 series · 2 of 2 positions shown · non-contrast
Comparison: Chest x-ray of 07/20/2013

CLINICAL DATA: History of sarcoidosis, uveitis, smoking history

EXAM:
CHEST  2 VIEW

[Series 1: dg chest 2 view · 0.14mm/px · 2 of 2 slices shown]
[im 1/2]
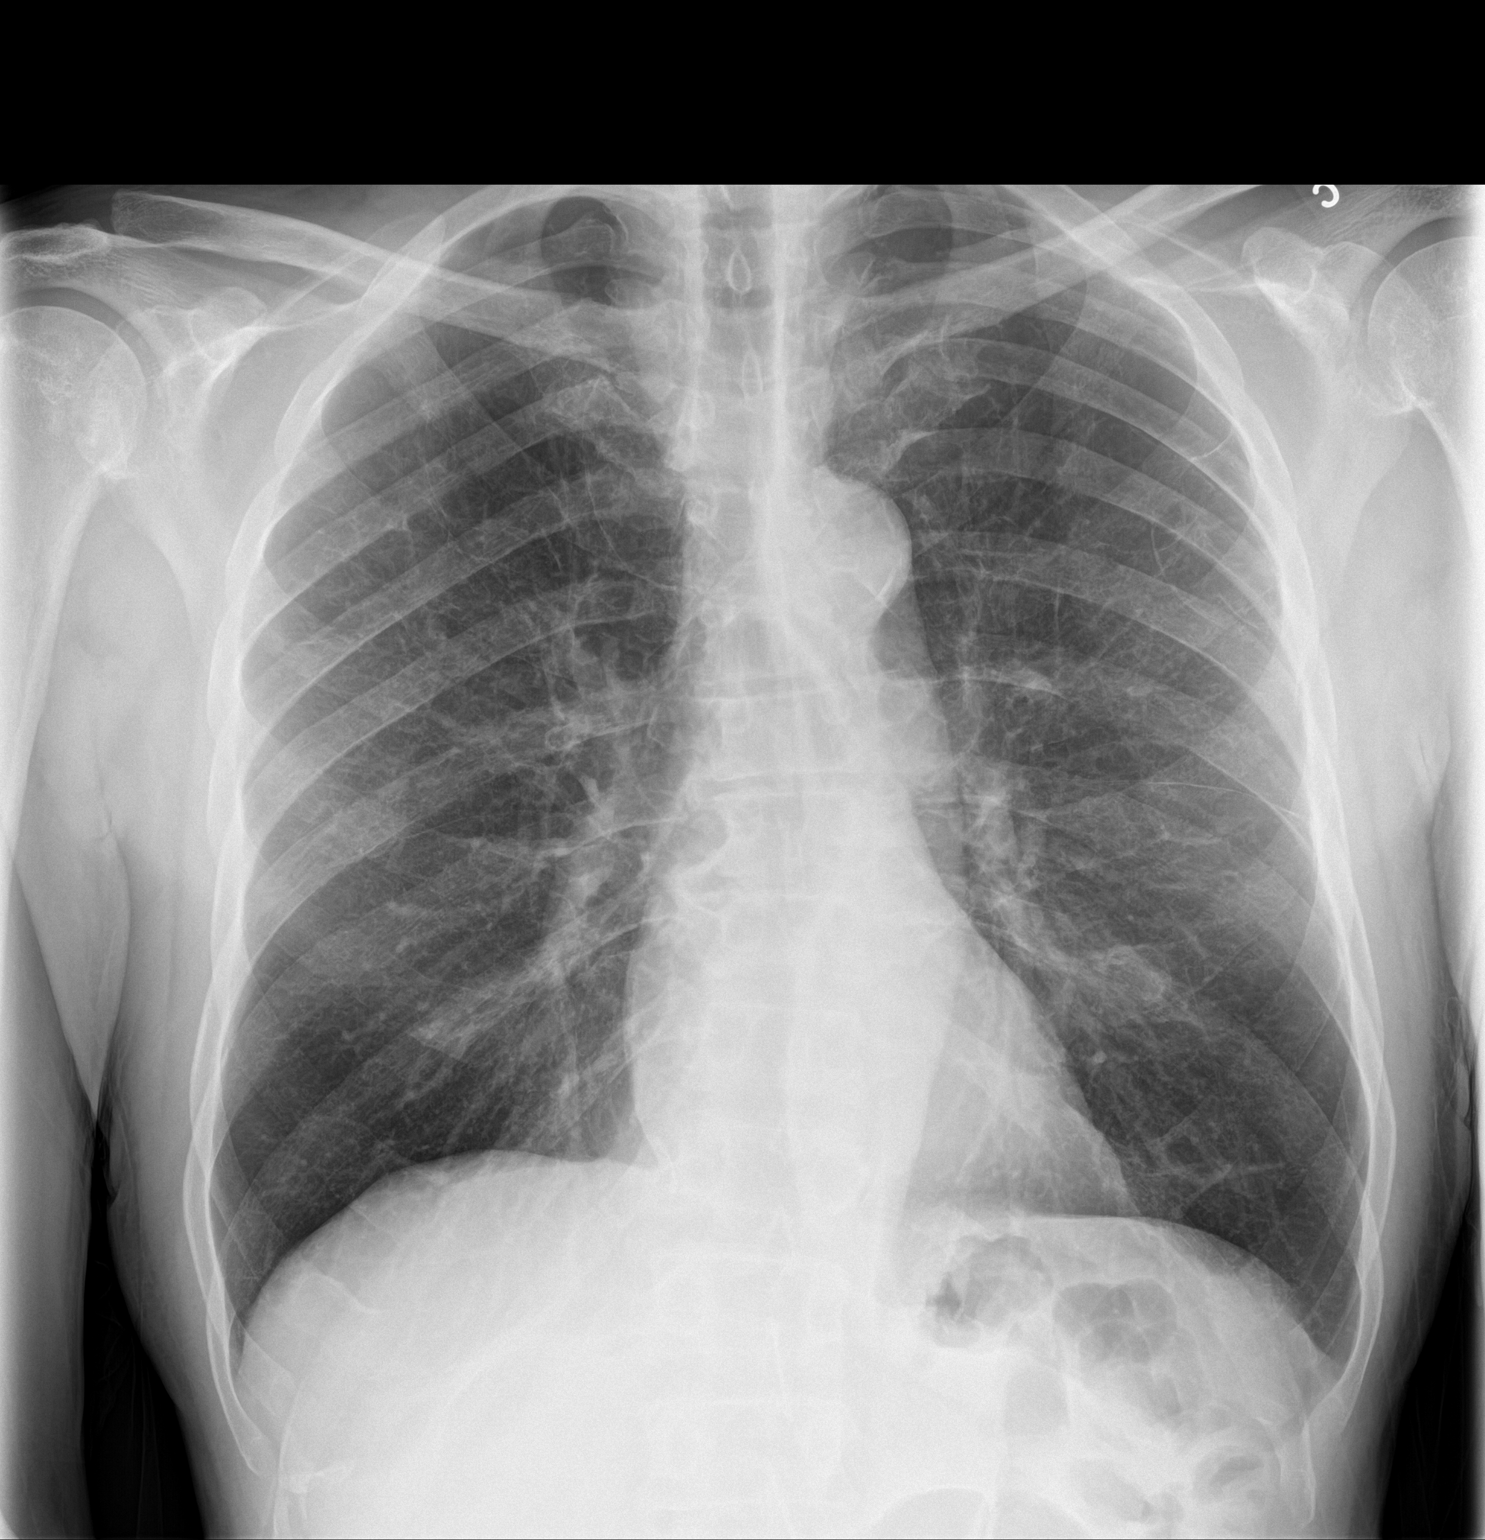
[im 2/2]
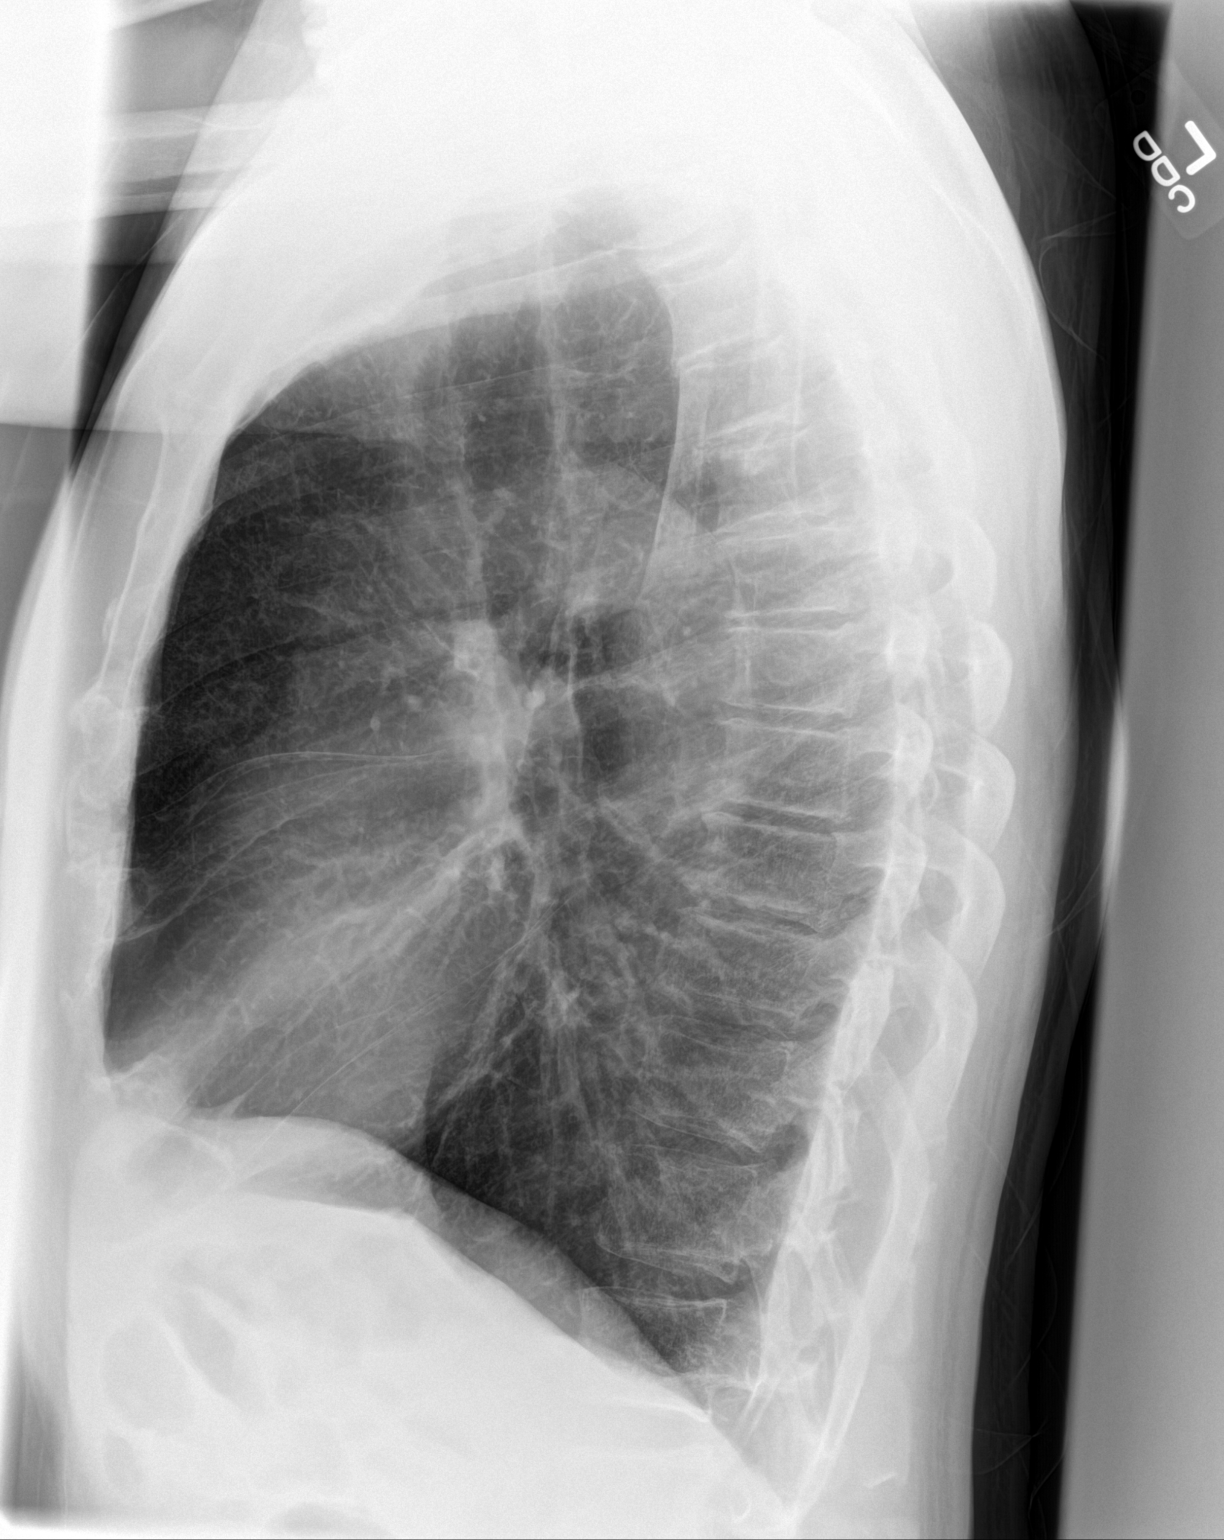

[2 of 2 positions shown; findings below may reference images not displayed]

FINDINGS: The lungs remain somewhat hyperaerated. Peribronchial thickening is
noted again consistent with bronchitis, probably chronic. There are
slightly more prominent interstitial markings in the perihilar
regions and in the lung bases possibly due to fibrosis in this
patient with history of sarcoidosis. No definite mediastinal or
hilar adenopathy is noted. The heart is within normal limits in
size. No bony abnormality is seen.
IMPRESSION: Hyperaeration. Probable chronic bronchitis. No definite active
process.

## 2016-03-16 ENCOUNTER — Telehealth: Payer: Self-pay | Admitting: Cardiovascular Disease

## 2016-03-16 MED ORDER — LISINOPRIL 10 MG PO TABS: 10.0000 mg | ORAL_TABLET | Freq: Every day | ORAL | 5 refills | Status: DC

## 2016-03-16 NOTE — Telephone Encounter (Signed)
°*  STAT* If patient is at the pharmacy, call can be transferred to refill team.   1. Which medications need to be refilled? (please list name of each medication and dose if known) Lisinopril 10 mg   2. Which pharmacy/location (including street and city if local pharmacy) is medication to be sent to? [facility] on [address]   3. Do they need a 30 day or 90 day supply? 90 day

## 2016-08-30 ENCOUNTER — Ambulatory Visit (INDEPENDENT_AMBULATORY_CARE_PROVIDER_SITE_OTHER): Payer: Managed Care, Other (non HMO) | Admitting: Cardiovascular Disease

## 2016-08-30 ENCOUNTER — Encounter: Payer: Self-pay | Admitting: Cardiovascular Disease

## 2016-08-30 VITALS — BP 130/80 | HR 65 | Ht 73.0 in | Wt 172.8 lb

## 2016-08-30 DIAGNOSIS — I10 Essential (primary) hypertension: Secondary | ICD-10-CM | POA: Diagnosis not present

## 2016-08-30 DIAGNOSIS — Z720 Tobacco use: Secondary | ICD-10-CM

## 2016-08-30 DIAGNOSIS — I2510 Atherosclerotic heart disease of native coronary artery without angina pectoris: Secondary | ICD-10-CM

## 2016-08-30 DIAGNOSIS — Z136 Encounter for screening for cardiovascular disorders: Secondary | ICD-10-CM

## 2016-08-30 NOTE — Progress Notes (Signed)
Cardiology Office Note   Date:  08/30/2016   ID:  Christin Jones, DOB 28-Oct-1940, MRN [id number]  PCP:  Juluis Pitch, MD  Cardiologist:   Kathlyn Sacramento, MD   Chief Complaint  Patient presents with  . other    6 month f/u. Meds reviewed verbally with pt.      History of Present Illness: Dale Becker is a 76 y.o. male who presents for  a followup visit regarding coronary artery disease. He presented in 01/2013 to [facility] with non-ST elevation myocardial infarction. EKG showed no acute changes. Peak troponin was 22. Echocardiogram showed normal LV and RV systolic function without significant valvular abnormalities. He underwent urgent cardiac catheterization which showed moderate disease in the mid left circumflex and proximal RCA. Pressure wire interrogation was performed on both and was not obstructive. The left circumflex appeared slightly hazy and possibly the site of a plaque rupture . No other etiology was found for his elevated troponin. D-dimer was normal and there was no strong clinical suspicion for pulmonary embolism. The patient was treated medically with no recurrent symptoms.  Since then, he had no recurrent cardiac events. He denies any chest pain or shortness of breath. Unfortunately, he continues to smoke. He did not tolerate atorvastatin, simvastatin or rosuvastatin due to myalgia. He Is currently on pravastatin ABI in June 2016 was normal and duplex showed diffuse nonobstructive disease.  He has been doing reasonably well and denies any chest pain, shortness of breath or dizziness. He is trying to cut down on smoking because it's too expensive. He has been taking his medications regularly.      . Hyperlipidemia   . Hypertension   . MI (myocardial infarction)   . Tobacco use     Past Surgical History:  Procedure Laterality Date  . BRAIN TUMOR EXCISION    . CARDIAC CATHETERIZATION  01/2013   [facility];EF 70 %     Current Outpatient Prescriptions  Medication  Sig Dispense Refill  . aspirin 81 MG tablet Take 81 mg by mouth daily.    Marland Kitchen lisinopril (PRINIVIL,ZESTRIL) 10 MG tablet Take 1 tablet (10 mg total) by mouth daily. 30 tablet 5  . NITROSTAT 0.4 MG SL tablet Place 0.4 mg under the tongue every 5 (five) minutes as needed.     . pravastatin (PRAVACHOL) 20 MG tablet Take 1 tablet (20 mg total) by mouth every evening. 30 tablet 5   No current facility-administered medications for this visit.     Allergies:   Patient has no known allergies.    Social History:  The patient  reports that he has been smoking Cigarettes.  He has a 50.00 pack-year smoking history. He has never used smokeless tobacco. He reports that he does not drink alcohol or use drugs.   Family History:  The patient's Family history is unknown by patient.    ROS:  Please see the history of present illness.   Otherwise, review of systems are positive for none.   All other systems are reviewed and negative.    PHYSICAL EXAM: VS:  BP 130/80 (BP Location: Left Arm, Patient Position: Sitting, Cuff Size: Normal)   Pulse 65   Ht 6\' 1"  (1.854 m)   Wt 172 lb 12 oz (78.4 kg)   BMI 22.79 kg/m  , BMI Body mass index is 22.79 kg/m. GEN: Well nourished, well developed, in no acute distress  HEENT: normal  Neck: no JVD, carotid bruits, or masses Cardiac: RRR; no murmurs, rubs, or gallops,no  edema  Respiratory:  clear to auscultation bilaterally, normal work of breathing GI: soft, nontender, nondistended, + BS MS: no deformity or atrophy  Skin: warm and dry, no rash Neuro:  Strength and sensation are intact Psych: euthymic mood, full affect   EKG:  EKG is ordered today. The ekg ordered today demonstrates normal sinus rhythm with no significant ST or T wave changes.   Recent Labs: No results found for requested labs within last 8760 hours.    Lipid Panel    Component Value Date/Time   CHOL 130 07/09/2014 0912   CHOL 162 01/26/2013 0402   TRIG 85 07/09/2014 0912   TRIG 76  01/26/2013 0402   HDL 31 (L) 07/09/2014 0912   HDL 39 (L) 01/26/2013 0402   CHOLHDL 4.2 07/09/2014 0912   VLDL 15 01/26/2013 0402   LDLCALC 82 07/09/2014 0912   LDLCALC 108 (H) 01/26/2013 0402      Wt Readings from Last 3 Encounters:  08/30/16 172 lb 12 oz (78.4 kg)  01/26/16 170 lb 8 oz (77.3 kg)  08/12/15 168 lb 4 oz (76.3 kg)         ASSESSMENT AND PLAN:  1.  Coronary artery disease involving native coronary arteries without angina:He is doing well from a cardiac standpoint with no anginal symptoms.  Continue medical therapy   2. Hyperlipidemia:  Continue pravastatin 20 mg daily. I requested fasting lipid and liver profile.   3. Essential hypertension: Blood pressure is controlled on current medications. Check basic metabolic profile   4. Tobacco use: I again discussed with him the importance of smoking cessation.   5. Abdominal aortic aneurysm screening: Previous duplex in 2016 showed that the mid and distal aorta was dilated but it was not measured. Given his age and smoking status I requested a dedicated abdominal aortic ultrasound   Disposition:   FU with me in 6 months  Signed,  Kathlyn Sacramento, MD  08/30/2016 2:11 PM    [place]

## 2016-08-30 NOTE — Patient Instructions (Addendum)
Medication Instructions:  Your physician recommends that you continue on your current medications as directed. Please refer to the Current Medication list given to you today.   Labwork: BMET, lipid and liver profile when you return for your abdominal aorta duplex. Nothing to eat or drink after midnight the evening before your labs.   Testing/Procedures: Your physician has requested that you have an abdominal aorta duplex. During this test, an ultrasound is used to evaluate the aorta. Allow 30 minutes for this exam. Do not eat after midnight the day before and avoid carbonated beverages   Follow-Up: Your physician wants you to follow-up in: 6 months with Dr. Fletcher Anon.  You will receive a reminder letter in the mail two months in advance. If you don't receive a letter, please call our office to schedule the follow-up appointment. c  Any Other Special Instructions Will Be Listed Below (If Applicable).     If you need a refill on your cardiac medications before your next appointment, please call your pharmacy.

## 2016-09-20 ENCOUNTER — Ambulatory Visit: Payer: Managed Care, Other (non HMO)

## 2016-09-20 ENCOUNTER — Other Ambulatory Visit (INDEPENDENT_AMBULATORY_CARE_PROVIDER_SITE_OTHER): Payer: Managed Care, Other (non HMO)

## 2016-09-20 DIAGNOSIS — Z136 Encounter for screening for cardiovascular disorders: Secondary | ICD-10-CM | POA: Diagnosis not present

## 2016-09-20 DIAGNOSIS — I2510 Atherosclerotic heart disease of native coronary artery without angina pectoris: Secondary | ICD-10-CM

## 2016-09-20 DIAGNOSIS — I10 Essential (primary) hypertension: Secondary | ICD-10-CM

## 2016-09-20 DIAGNOSIS — I700 Atherosclerosis of aorta: Secondary | ICD-10-CM | POA: Diagnosis not present

## 2016-09-21 LAB — BASIC METABOLIC PANEL
BUN / CREAT RATIO: 6 — AB (ref 10–24)
BUN: 7 mg/dL — AB (ref 8–27)
CO2: 23 mmol/L (ref 18–29)
CREATININE: 1.14 mg/dL (ref 0.76–1.27)
Calcium: 9.6 mg/dL (ref 8.6–10.2)
Chloride: 102 mmol/L (ref 96–106)
GFR calc non Af Amer: 63 mL/min/{1.73_m2} (ref >59)
GFR, EST AFRICAN AMERICAN: 73 mL/min/{1.73_m2} (ref >59)
GLUCOSE: 87 mg/dL (ref 65–99)
Potassium: 4.1 mmol/L (ref 3.5–5.2)
SODIUM: 143 mmol/L (ref 134–144)

## 2016-09-21 LAB — LIPID PANEL
CHOL/HDL RATIO: 6.3 ratio — AB (ref 0.0–5.0)
CHOLESTEROL TOTAL: 188 mg/dL (ref 100–199)
HDL: 30 mg/dL — ABNORMAL LOW (ref >39)
LDL CALC: 118 mg/dL — AB (ref 0–99)
Triglycerides: 202 mg/dL — ABNORMAL HIGH (ref 0–149)
VLDL CHOLESTEROL CAL: 40 mg/dL (ref 5–40)

## 2016-09-21 LAB — HEPATIC FUNCTION PANEL
ALBUMIN: 4.5 g/dL (ref 3.5–4.8)
ALT: 12 IU/L (ref 0–44)
AST: 14 IU/L (ref 0–40)
Alkaline Phosphatase: 75 IU/L (ref 39–117)
BILIRUBIN TOTAL: 0.4 mg/dL (ref 0.0–1.2)
Bilirubin, Direct: 0.11 mg/dL (ref 0.00–0.40)
TOTAL PROTEIN: 7.8 g/dL (ref 6.0–8.5)

## 2016-09-23 ENCOUNTER — Other Ambulatory Visit: Payer: Self-pay

## 2017-02-08 ENCOUNTER — Other Ambulatory Visit: Payer: Self-pay

## 2017-02-08 MED ORDER — PRAVASTATIN SODIUM 20 MG PO TABS: 20.0000 mg | ORAL_TABLET | Freq: Every evening | ORAL | 5 refills | Status: DC

## 2017-02-08 MED ORDER — LISINOPRIL 10 MG PO TABS: 10.0000 mg | ORAL_TABLET | Freq: Every day | ORAL | 5 refills | Status: DC

## 2017-02-08 NOTE — Telephone Encounter (Signed)
Refill sent for Pravastatin.

## 2017-02-08 NOTE — Telephone Encounter (Signed)
Refill sent for lisinopril  

## 2017-02-09 ENCOUNTER — Other Ambulatory Visit: Payer: Self-pay

## 2017-02-09 MED ORDER — PRAVASTATIN SODIUM 20 MG PO TABS: 20.0000 mg | ORAL_TABLET | Freq: Every evening | ORAL | 5 refills | Status: DC

## 2017-02-09 MED ORDER — LISINOPRIL 10 MG PO TABS: 10.0000 mg | ORAL_TABLET | Freq: Every day | ORAL | 5 refills | Status: DC

## 2017-04-07 ENCOUNTER — Ambulatory Visit (INDEPENDENT_AMBULATORY_CARE_PROVIDER_SITE_OTHER): Payer: 59 | Admitting: Cardiovascular Disease

## 2017-04-07 ENCOUNTER — Encounter: Payer: Self-pay | Admitting: Cardiovascular Disease

## 2017-04-07 VITALS — BP 142/91 | HR 68 | Ht 73.0 in | Wt 166.8 lb

## 2017-04-07 DIAGNOSIS — I10 Essential (primary) hypertension: Secondary | ICD-10-CM

## 2017-04-07 DIAGNOSIS — Z79899 Other long term (current) drug therapy: Secondary | ICD-10-CM

## 2017-04-07 DIAGNOSIS — E785 Hyperlipidemia, unspecified: Secondary | ICD-10-CM

## 2017-04-07 DIAGNOSIS — I714 Abdominal aortic aneurysm, without rupture, unspecified: Secondary | ICD-10-CM

## 2017-04-07 DIAGNOSIS — I2510 Atherosclerotic heart disease of native coronary artery without angina pectoris: Secondary | ICD-10-CM

## 2017-04-07 MED ORDER — LISINOPRIL 20 MG PO TABS: 20.0000 mg | ORAL_TABLET | Freq: Every day | ORAL | 3 refills | Status: DC

## 2017-04-07 MED ORDER — PRAVASTATIN SODIUM 40 MG PO TABS: 40.0000 mg | ORAL_TABLET | Freq: Every evening | ORAL | 3 refills | Status: DC

## 2017-04-07 NOTE — Patient Instructions (Signed)
Medication Instructions:  Your physician has recommended you make the following change in your medication:  1- INCREASE Lisinopril to 20 mg by mouth once a day. 2- TAKE Pravastatin 40 mg by mouth once a day.   Labwork: Your physician recommends that you return for lab work in: [place] 05/19/17. - You will need to be FASTING. DO NOT EAT OR DRINK AFTER MIDNIGHT THE MORNING YOU HAVE THE LAB WORK.  - Please go to the [facility]. You will check in at the front desk to the right as you walk into the atrium. Valet Parking is offered if needed.  - NO APPOINTMENT NEEDED.    Testing/Procedures: Your physician has requested that you have an abdominal aorta duplex IN February 2019. During this test, an ultrasound is used to evaluate the aorta. Allow 30 minutes for this exam. Do not eat after midnight the day before and avoid carbonated beverages   Follow-Up: Your physician wants you to follow-up in: [place]. You will receive a reminder letter in the mail two months in advance. If you don't receive a letter, please call our office to schedule the follow-up appointment.  Your physician recommends that you schedule a follow-up appointment WITH YOUR PRIMARY CARE PHYSICIAN AS SOON AS POSSIBLE.    If you need a refill on your cardiac medications before your next appointment, please call your pharmacy.

## 2017-04-07 NOTE — Progress Notes (Signed)
Cardiology Office Note   Date:  08/30/2016   ID:  Dale Becker, DOB [birth year], MRN [id number]  PCP:  Juluis Pitch, MD  Cardiologist:   Kathlyn Sacramento, MD   Chief Complaint  Patient presents with  . other    6 month f/u. Meds reviewed verbally with pt.      History of Present Illness: Dale Becker is a 76 y.o. male who presents for  a followup visit regarding coronary artery disease. He Had non-ST elevation myocardial infarction in June 2014. Echocardiogram showed normal LV and RV systolic function without significant valvular abnormalities. He underwent  cardiac catheterization which showed moderate disease in the mid left circumflex and proximal RCA. Pressure wire interrogation was performed on both and was not obstructive. The left circumflex appeared slightly hazy and possibly the site of a plaque rupture .  Unfortunately, he continues to smoke. He did not tolerate atorvastatin, simvastatin or rosuvastatin due to myalgia. He Is currently on pravastatin ABI in June 2016 was normal and duplex showed diffuse nonobstructive disease.  He was found to have a small aortic aneurysm on recent screening. He is asymptomatic. He is overall doing reasonably well and denies any chest pain or shortness of breath. He has not been following healthy diet.    . Hyperlipidemia   . Hypertension   . MI (myocardial infarction)   . Tobacco use     Past Surgical History:  Procedure Laterality Date  . BRAIN TUMOR EXCISION    . CARDIAC CATHETERIZATION  01/2013   [facility];EF 70 %     Current Outpatient Prescriptions  Medication Sig Dispense Refill  . aspirin 81 MG tablet Take 81 mg by mouth daily.    Marland Kitchen lisinopril (PRINIVIL,ZESTRIL) 10 MG tablet Take 1 tablet (10 mg total) by mouth daily. 30 tablet 5  . NITROSTAT 0.4 MG SL tablet Place 0.4 mg under the tongue every 5 (five) minutes as needed.     . pravastatin (PRAVACHOL) 20 MG tablet Take 1 tablet (20 mg total) by mouth every evening. 30  tablet 5   No current facility-administered medications for this visit.     Allergies:   Patient has no known allergies.    Social History:  The patient  reports that he has been smoking Cigarettes.  He has a 50.00 pack-year smoking history. He has never used smokeless tobacco. He reports that he does not drink alcohol or use drugs.   Family History:  The patient's Family history is unknown by patient.    ROS:  Please see the history of present illness.   Otherwise, review of systems are positive for none.   All other systems are reviewed and negative.    PHYSICAL EXAM: VS:  BP 130/80 (BP Location: Left Arm, Patient Position: Sitting, Cuff Size: Normal)   Pulse 65   Ht 6\' 1"  (1.854 m)   Wt 172 lb 12 oz (78.4 kg)   BMI 22.79 kg/m  , BMI Body mass index is 22.79 kg/m. GEN: Well nourished, well developed, in no acute distress  HEENT: normal  Neck: no JVD, carotid bruits, or masses Cardiac: RRR; no murmurs, rubs, or gallops,no edema  Respiratory:  clear to auscultation bilaterally, normal work of breathing GI: soft, nontender, nondistended, + BS MS: no deformity or atrophy  Skin: warm and dry, no rash Neuro:  Strength and sensation are intact Psych: euthymic mood, full affect   EKG:  EKG is ordered today. The ekg ordered today demonstrates normal sinus rhythm with  no significant ST or T wave changes.   Recent Labs: No results found for requested labs within last 8760 hours.    Lipid Panel    Component Value Date/Time   CHOL 130 07/09/2014 0912   CHOL 162 01/26/2013 0402   TRIG 85 07/09/2014 0912   TRIG 76 01/26/2013 0402   HDL 31 (L) 07/09/2014 0912   HDL 39 (L) 01/26/2013 0402   CHOLHDL 4.2 07/09/2014 0912   VLDL 15 01/26/2013 0402   LDLCALC 82 07/09/2014 0912   LDLCALC 108 (H) 01/26/2013 0402      Wt Readings from Last 3 Encounters:  08/30/16 172 lb 12 oz (78.4 kg)  01/26/16 170 lb 8 oz (77.3 kg)  08/12/15 168 lb 4 oz (76.3 kg)         ASSESSMENT  AND PLAN:  1.  Coronary artery disease involving native coronary arteries without angina:He is doing well from a cardiac standpoint with no anginal symptoms.  Continue medical therapy   2. Hyperlipidemia:  His LDL was above 100 on most recent testing and since then I increased pravastatin 40 mg daily but it appears that he is taking only 20. I increased the dose again to 40. He did not tolerate other statins due to myalgia.  3. Essential hypertension: Blood pressure is  mildly elevated. I increased lisinopril to 20 mg daily.  4. Tobacco use: I again discussed with him the importance of smoking cessation.   5.  Small abdominal aortic aneurysm: Repeat ultrasound in February 2019.  Disposition:   FU with me in 6 months. The patient has not seen his primary care physician in many years and I strongly advised him to schedule an appointment.   Signed,  Kathlyn Sacramento, MD  08/30/2016 2:11 PM    [facility]

## 2017-06-29 ENCOUNTER — Telehealth: Payer: Self-pay | Admitting: *Deleted

## 2017-06-29 DIAGNOSIS — I2510 Atherosclerotic heart disease of native coronary artery without angina pectoris: Secondary | ICD-10-CM

## 2017-06-29 DIAGNOSIS — E785 Hyperlipidemia, unspecified: Secondary | ICD-10-CM

## 2017-06-29 NOTE — Telephone Encounter (Signed)
Patient was to get lipid/liver panel in October. Patient did not go. Called as friendly reminder for patient to go to [facility] for the lab work. He verbalized understanding to be fasting and go to the [facility] for lab work at his earliest convenience.

## 2017-09-13 ENCOUNTER — Telehealth: Payer: Self-pay | Admitting: Cardiovascular Disease

## 2017-09-13 NOTE — Telephone Encounter (Signed)
Pt is due for February OV for 6 month follow up and AAA duplex. These can be on the same day.  Routed to Support Pool to schedule.

## 2017-09-13 NOTE — Telephone Encounter (Signed)
° °   [facility] Pre-operative Risk Assessment    Request for surgical clearance:  1. What type of surgery is being performed? Multiple dental extractions - lower level of teeth including the roots are being removed for dentures    2. When is this surgery scheduled?  N/A  3. What type of clearance is required (medical clearance vs. Pharmacy clearance to hold med vs. Both)? Medical clearance and if any medications should be held and for how long   4. Are there any medications that need to be held prior to surgery and how long? Review all medications as well as aspirin   5. Practice name and name of physician performing surgery?  Dr. Gaspar Bidding, [place]  6. What is your office phone and fax number? [phone]   7. Anesthesia type (None, local, MAC, general) ? Local and possible anesthesia with oral surgeon    Ace Gins 09/13/2017, 10:54 AM  _________________________________________________________________   (provider comments below)  \

## 2017-09-15 NOTE — Telephone Encounter (Signed)
Pt scheduled 09/28/17.

## 2017-09-16 NOTE — Telephone Encounter (Signed)
Pt scheduled 2/19 AAA and office visit with Ignacia Bayley, NP.

## 2017-10-04 ENCOUNTER — Encounter: Payer: Self-pay | Admitting: Nurse Practitioner

## 2017-10-04 ENCOUNTER — Ambulatory Visit: Payer: 59 | Admitting: Nurse Practitioner

## 2017-10-04 ENCOUNTER — Ambulatory Visit (INDEPENDENT_AMBULATORY_CARE_PROVIDER_SITE_OTHER): Payer: 59

## 2017-10-04 VITALS — BP 120/70 | HR 56 | Ht 73.0 in | Wt 165.8 lb

## 2017-10-04 DIAGNOSIS — Z720 Tobacco use: Secondary | ICD-10-CM | POA: Diagnosis not present

## 2017-10-04 DIAGNOSIS — I714 Abdominal aortic aneurysm, without rupture, unspecified: Secondary | ICD-10-CM

## 2017-10-04 DIAGNOSIS — E785 Hyperlipidemia, unspecified: Secondary | ICD-10-CM

## 2017-10-04 DIAGNOSIS — I10 Essential (primary) hypertension: Secondary | ICD-10-CM | POA: Diagnosis not present

## 2017-10-04 DIAGNOSIS — I2510 Atherosclerotic heart disease of native coronary artery without angina pectoris: Secondary | ICD-10-CM

## 2017-10-04 NOTE — Progress Notes (Signed)
Office Visit    Patient Name: Dale Becker Date of Encounter: 10/04/2017  Primary Care Provider:  Patient, No Pcp Per Primary Cardiologist:  Kathlyn Sacramento, MD  Chief Complaint    77 y/o ? with a h/o CAD s/p prior MI, HTN, HL, tob abuse, AAA, and brain tumor s/p resection, who presents for f/u.  Past Medical History    Past Medical History:  Diagnosis Date  . AAA (abdominal aortic aneurysm) ([place])    a. 09/2016 Ao Duplex: 3.2 x 3.4 cm juxtarenal AAA - 3.1 cm in length.  . Brain tumor ([place])   . Coronary artery disease 01/2013   Presented in June of 2014 with non-ST elevation myocardial infarction(peak troponin of 22). Cardiac catheterization showed moderate nonobstructive disease in the left circumflex and proximal RCA with an FFR ratio of 0.88 and 0.90 respectively. Ejection fraction was 70% and hyperdynamic. MI was likely due to plaque rupture without obstructive disease.   Marland Kitchen History of echocardiogram    a. 02/2013 Echo: EF 55-60%, mild TR.  Marland Kitchen Hyperlipidemia   . Hypertension   . MI (myocardial infarction) ([place]) 01/2013  . Tobacco use    Past Surgical History:  Procedure Laterality Date  . BRAIN TUMOR EXCISION    . CARDIAC CATHETERIZATION  01/2013   [facility];EF 70 %    Allergies  Allergies  Allergen Reactions  . Atorvastatin     Myalgias   . Rosuvastatin     Myalgias   . Simvastatin     Myalgias     History of Present Illness    77 year old ?   With the above past medical history including coronary artery disease status post non-STEMI in June 2014 with catheterization at that time showing moderate nonobstructive disease in the left circumflex and proximal RCA.  Fractional flow reserve in each area was normal and LV function was normal.  He was medically managed.  Other history includes hypertension, hyperlipidemia, ongoing tobacco abuse, abdominal aortic aneurysm, and prior history of brain tumor status post resection.  He was last seen in clinic in August 2018.   Since then, he reports having done well.  He says he is generally active and warmer weather, walking frequently but has not been colder, his activity has been reduced.  That said, he still drives and is able to go to the supermarket.  He denies chest pain, dyspnea, PND, orthopnea, dizziness, syncope, edema, early satiety, or claudication.  He continues to smoke 1 pack of cigarettes per day.  He is not currently considering quitting.  He is pending multiple tooth extractions in preparation for dentures.  He is not looking forward to this and considering delaying.  Home Medications    Prior to Admission medications   Medication Sig Start Date End Date Taking? Authorizing Provider  aspirin 81 MG tablet Take 81 mg by mouth daily.   Yes [provider]  lisinopril (PRINIVIL,ZESTRIL) 20 MG tablet Take 1 tablet (20 mg total) by mouth daily. 04/07/17 10/04/17 Yes Arida, Mertie Clause, MD  NITROSTAT 0.4 MG SL tablet Place 0.4 mg under the tongue every 5 (five) minutes as needed.  01/30/13  Yes [provider]  pravastatin (PRAVACHOL) 40 MG tablet Take 1 tablet (40 mg total) by mouth every evening. 04/07/17 10/04/17 Yes Wellington Hampshire, MD    Review of Systems    He denies chest pain, palpitations, dyspnea, pnd, orthopnea, n, v, dizziness, syncope, edema, weight gain, or early satiety.  All other systems reviewed and are  otherwise negative except as noted above.  Physical Exam    VS:  BP 120/70 (BP Location: Left Arm, Patient Position: Sitting, Cuff Size: Normal)   Pulse (!) 56   Ht 6\' 1"  (1.854 m)   Wt 165 lb 12 oz (75.2 kg)   BMI 21.87 kg/m  , BMI Body mass index is 21.87 kg/m. GEN: Well nourished, well developed, in no acute distress.  HEENT: normal.  Neck: Supple, no JVD, carotid bruits, or masses. Cardiac: RRR, distant, no murmurs, rubs, or gallops. No clubbing, cyanosis, edema.  Radials/DP/PT 2+ and equal bilaterally.  Respiratory:  Respirations regular and unlabored, clear to  auscultation bilaterally. GI: Soft, nontender, nondistended, BS + x 4. MS: no deformity or atrophy. Skin: warm and dry, no rash. Neuro:  Strength and sensation are intact. Psych: Normal affect.  Accessory Clinical Findings    ECG -sinus bradycardia cardia, 56, left axis deviation, right bundle branch block (new)  Assessment & Plan    1.  Coronary artery disease: Status post non-ST segment elevation myocardial infarction in June 2014.  He is known to have moderate, nonobstructive RCA and circumflex disease with normal fractional flow reserves at that time.  Since his last clinic visit in August, he has continued to do well.  He has not been as active over the winter as he would normally be in the summer but is able to carry out routine chores around the house and while shopping without any symptoms or limitations.  He remains on aspirin, ACE inhibitor, and statin therapy.  He has not required any sublingual nitroglycerin.  No changes to medications today.  He is pending multiple tooth extractions.  He does not require ischemic evaluation prior to this.  I would recommend continuation of aspirin throughout the perioperative period.  2.  Essential hypertension: Stable on higher dose of lisinopril.  3.  Hyperlipidemia: Tolerating Pravachol 40 mg.  This was increased last year.  He was previously intolerant to atorvastatin, simvastatin, and rosuvastatin secondary to myalgias.  He is fasting today and it has been a year since his last lipid panel I will follow-up lipids and LFTs today.  Would plan to add Zetia if LDL greater than 70.  4.  Abdominal aortic aneurysm: He just had duplex this morning right before the clinic visit.  Results pending.  Blood pressure well controlled.  5.  Tob Abuse:  Still smoking up to 1ppd.  Not interested in quitting.  Complete cessation advised.  6.  Disposition: Follow-up lipids, LFTs, and metabolic panel this morning.  We will be in touch with him with abdominal  ultrasound results.  Follow-up in 6 months or sooner if necessary.   Murray Hodgkins, NP 10/04/2017, 9:04 AM

## 2017-10-04 NOTE — Patient Instructions (Signed)
Medication Instructions:  Your physician recommends that you continue on your current medications as directed. Please refer to the Current Medication list given to you today.   Labwork: Your physician recommends that you return for lab work in: TODAY (CMET, LIPID).   Testing/Procedures: none  Follow-Up: Your physician wants you to follow-up in: [place].  You will receive a reminder letter in the mail two months in advance. If you don't receive a letter, please call our office to schedule the follow-up appointment.  If you need a refill on your cardiac medications before your next appointment, please call your pharmacy.

## 2017-10-05 LAB — LIPID PANEL
CHOLESTEROL TOTAL: 155 mg/dL (ref 100–199)
Chol/HDL Ratio: 4.8 ratio (ref 0.0–5.0)
HDL: 32 mg/dL — AB (ref >39)
LDL Calculated: 101 mg/dL — ABNORMAL HIGH (ref 0–99)
TRIGLYCERIDES: 110 mg/dL (ref 0–149)
VLDL CHOLESTEROL CAL: 22 mg/dL (ref 5–40)

## 2017-10-05 LAB — COMPREHENSIVE METABOLIC PANEL
ALBUMIN: 4.1 g/dL (ref 3.5–4.8)
ALT: 12 IU/L (ref 0–44)
AST: 15 IU/L (ref 0–40)
Albumin/Globulin Ratio: 1.1 — ABNORMAL LOW (ref 1.2–2.2)
Alkaline Phosphatase: 79 IU/L (ref 39–117)
BILIRUBIN TOTAL: 0.4 mg/dL (ref 0.0–1.2)
BUN / CREAT RATIO: 8 — AB (ref 10–24)
BUN: 9 mg/dL (ref 8–27)
CHLORIDE: 103 mmol/L (ref 96–106)
CO2: 18 mmol/L — ABNORMAL LOW (ref 20–29)
Calcium: 9.8 mg/dL (ref 8.6–10.2)
Creatinine, Ser: 1.13 mg/dL (ref 0.76–1.27)
GFR calc Af Amer: 74 mL/min/{1.73_m2} (ref >59)
GFR calc non Af Amer: 64 mL/min/{1.73_m2} (ref >59)
GLOBULIN, TOTAL: 3.6 g/dL (ref 1.5–4.5)
GLUCOSE: 87 mg/dL (ref 65–99)
Potassium: 5.5 mmol/L — ABNORMAL HIGH (ref 3.5–5.2)
Sodium: 141 mmol/L (ref 134–144)
Total Protein: 7.7 g/dL (ref 6.0–8.5)

## 2017-10-10 ENCOUNTER — Telehealth: Payer: Self-pay | Admitting: Nurse Practitioner

## 2017-10-10 NOTE — Telephone Encounter (Signed)
Routed 2/19 OV note addressing clearance for dental procedure to [place] Phone: [phone] Fax: [phone]

## 2017-10-11 ENCOUNTER — Telehealth: Payer: Self-pay | Admitting: *Deleted

## 2017-10-11 ENCOUNTER — Other Ambulatory Visit: Payer: Self-pay | Admitting: *Deleted

## 2017-10-11 DIAGNOSIS — I714 Abdominal aortic aneurysm, without rupture, unspecified: Secondary | ICD-10-CM

## 2017-10-11 DIAGNOSIS — E785 Hyperlipidemia, unspecified: Secondary | ICD-10-CM

## 2017-10-11 DIAGNOSIS — Z79899 Other long term (current) drug therapy: Secondary | ICD-10-CM

## 2017-10-11 DIAGNOSIS — I10 Essential (primary) hypertension: Secondary | ICD-10-CM

## 2017-10-11 MED ORDER — EZETIMIBE 10 MG PO TABS: 10.0000 mg | ORAL_TABLET | Freq: Every day | ORAL | 3 refills | Status: DC

## 2017-10-11 NOTE — Telephone Encounter (Signed)
Patient called to review the lab work results and plan of care as discussed earlier today from lab result.  He verbalized [facility], go to his pharmacy to pick up zetia, and to get lab work in about 8 weeks and to be fasting.

## 2017-10-12 ENCOUNTER — Telehealth: Payer: Self-pay

## 2017-10-12 ENCOUNTER — Other Ambulatory Visit
Admission: RE | Admit: 2017-10-12 | Discharge: 2017-10-12 | Disposition: A | Discharge status: Home / Self Care | Payer: 59 | Source: Ambulatory Visit | Attending: Nurse Practitioner | Admitting: Nurse Practitioner

## 2017-10-12 DIAGNOSIS — E785 Hyperlipidemia, unspecified: Secondary | ICD-10-CM | POA: Diagnosis present

## 2017-10-12 DIAGNOSIS — I10 Essential (primary) hypertension: Secondary | ICD-10-CM | POA: Diagnosis not present

## 2017-10-12 DIAGNOSIS — Z79899 Other long term (current) drug therapy: Secondary | ICD-10-CM | POA: Diagnosis present

## 2017-10-12 LAB — BASIC METABOLIC PANEL
ANION GAP: 7 (ref 5–15)
BUN: 10 mg/dL (ref 6–20)
CALCIUM: 9.2 mg/dL (ref 8.9–10.3)
CO2: 27 mmol/L (ref 22–32)
Chloride: 105 mmol/L (ref 101–111)
Creatinine, Ser: 1.25 mg/dL — ABNORMAL HIGH (ref 0.61–1.24)
GFR calc Af Amer: >60 mL/min (ref >60)
GFR, EST NON AFRICAN AMERICAN: 55 mL/min — AB (ref >60)
GLUCOSE: 93 mg/dL (ref 65–99)
Potassium: 3.9 mmol/L (ref 3.5–5.1)
Sodium: 139 mmol/L (ref 135–145)

## 2017-10-12 LAB — LIPID PANEL
Cholesterol: 146 mg/dL (ref 0–200)
HDL: 35 mg/dL — AB (ref >40)
LDL Cholesterol: 89 mg/dL (ref 0–99)
Total CHOL/HDL Ratio: 4.2 RATIO
Triglycerides: 110 mg/dL (ref <150)
VLDL: 22 mg/dL (ref 0–40)

## 2017-10-12 NOTE — Telephone Encounter (Signed)
Pt calling he is confused about which medications he is to pick up at pharmacy today Please call back

## 2017-10-12 NOTE — Telephone Encounter (Signed)
Spoke with patient and reviewed medications. He wanted to confirm if he should stay on both medications. Reviewed last office visit notes and confirmed that he should stay on both. He verbalized understanding of our conversation, medications, and instructed him to call back if any further questions.

## 2018-02-20 ENCOUNTER — Telehealth: Payer: Self-pay | Admitting: Cardiovascular Disease

## 2018-02-20 MED ORDER — EZETIMIBE 10 MG PO TABS: 10.0000 mg | ORAL_TABLET | Freq: Every day | ORAL | 3 refills | Status: DC

## 2018-02-20 NOTE — Telephone Encounter (Signed)
Called patient. He was seen at [facility] on Saturday, July 6 for angioedema of the lips. According to Care Everywhere, patient was instructed to stop his lisinopril immediately and contact his prescribing doctor for further instructions. Patient states he woke up Saturday morning, was fine and then took his lisinopril. About 30 minutes later his lips and face began swelling. He says this same thing happened last year when he was taking lisinopril 10 mg, most recently on 20 mg. According to Care Everywhere, patient's BP on 02/18/18 was 126/86, HR 81. Patient sometimes checks his BP at the local drugstore. He does not have his own BP cuff. Patient due for 6 month f/u in August so this was scheduled for 04/13/18 with Christell Faith, PA-C. Advised patient I will make Dr Fletcher Anon aware of him stopping lisinopril and let him know if there are any further recommendations.

## 2018-02-20 NOTE — Telephone Encounter (Signed)
Patient had to go to urgent care on Saturday morning due to swelling in face and lips after starting new bp medication.  MD at facility advised to dc the medication and gave him something to take to help symptoms ( unclear on specifics of medication unable to understand patient clearly )

## 2018-02-24 NOTE — Telephone Encounter (Signed)
Stop lisinopril and add to allergies list. Add amlodipine 5 mg once daily.

## 2018-02-27 NOTE — Telephone Encounter (Signed)
Lisinopril added to allergy list. No answer. Left message to call back.

## 2018-02-28 MED ORDER — AMLODIPINE BESYLATE 5 MG PO TABS: 5.0000 mg | ORAL_TABLET | Freq: Every day | ORAL | 3 refills | Status: DC

## 2018-02-28 NOTE — Telephone Encounter (Signed)
Patient verbalized understanding to remain off lisinopril and to start amlodipine 5 mg daily and continue remaining medications. He stated he will pick up new prescription tomorrow from [facility]. Rx sent to pharmacy.

## 2018-04-11 NOTE — Progress Notes (Signed)
Office Visit    Patient Name: Dale Becker Date of Encounter: 04/13/2018  Primary Care Provider:  Patient, No Pcp Per Primary Cardiologist:  Kathlyn Sacramento, MD  Chief Complaint    77 y/o ? with a history of CAD status post prior MI, hypertension, hyperlipidemia, tobacco abuse, abdominal aortic aneurysm, and brain tumor status post resection, who presents for follow-up.  Past Medical History    Past Medical History:  Diagnosis Date  . AAA (abdominal aortic aneurysm) ([place])    a. 09/2016 Ao Duplex: 3.2 x 3.4 cm juxtarenal AAA - 3.1 cm in length; b. 09/2017 Ao Duplex: largest Ao measurement 3.4cm. Overall stable.  . Brain tumor ([place])   . Coronary artery disease 01/2013   Presented in June of 2014 with non-ST elevation myocardial infarction(peak troponin of 22). Cardiac catheterization showed moderate nonobstructive disease in the left circumflex and proximal RCA with an FFR ratio of 0.88 and 0.90 respectively. Ejection fraction was 70% and hyperdynamic. MI was likely due to plaque rupture without obstructive disease.   Marland Kitchen History of echocardiogram    a. 02/2013 Echo: EF 55-60%, mild TR.  Marland Kitchen Hyperlipidemia   . Hypertension   . MI (myocardial infarction) ([place]) 01/2013  . Tobacco use    Past Surgical History:  Procedure Laterality Date  . BRAIN TUMOR EXCISION    . CARDIAC CATHETERIZATION  01/2013   [facility];EF 70 %    Allergies  Allergies  Allergen Reactions  . Atorvastatin     Myalgias   . Lisinopril Other (See Comments)    Angioedema.  . Rosuvastatin     Myalgias   . Simvastatin     Myalgias     History of Present Illness    77 year old male with the above past medical history including CAD status post non-STEMI in June 2014 with catheterization at that time showing moderate, nonobstructive disease in the left circumflex and proximal RCA.  Fractional flow reserve in each area was normal and LV function was normal.  He was medically managed.  Other history includes  hypertension, hyperlipidemia, ongoing tobacco abuse, abdominal aortic aneurysm, and prior history of brain tumor status post resection.  He was last seen in clinic in February 2019, at which time he was doing well without symptoms or limitations.  BP was stable on lisinopril, however, he was seen [facility] on 7/6 with facial swelling and presumed angioedema.  Lisinopril was d/c'd and after contact w/ our office, he was placed on amlodipine 5mg  daily.  He has tolerated amlodipine well.  He does not routinely check his blood pressure.  Following his last visit he was placed on Zetia 10 mg however, this resulted in myalgias and he has subsequently discontinued.  He is not interested in P[employer].  Since his last visit, he remains active, working 2 jobs.  He does not experience chest pain or dyspnea.  He continues to smoke about half to two thirds of a pack a day.  He says he is trying to quit.  He denies PND, orthopnea, dizziness, syncope, edema, or early satiety.  Home Medications    Prior to Admission medications   Medication Sig Start Date End Date Taking? Authorizing Provider  amLODipine (NORVASC) 5 MG tablet Take 1 tablet (5 mg total) by mouth daily. 02/28/18 05/29/18  Wellington Hampshire, MD  aspirin 81 MG tablet Take 81 mg by mouth daily.    [provider]  ezetimibe (ZETIA) 10 MG tablet Take 1 tablet (10 mg total) by  mouth daily. 02/20/18 05/21/18  Wellington Hampshire, MD  NITROSTAT 0.4 MG SL tablet Place 0.4 mg under the tongue every 5 (five) minutes as needed.  01/30/13   [provider]  pravastatin (PRAVACHOL) 40 MG tablet Take 1 tablet (40 mg total) by mouth every evening. 04/07/17 10/04/17  Wellington Hampshire, MD    Review of Systems    He denies chest pain, palpitations, dyspnea, pnd, orthopnea, n, v, dizziness, syncope, edema, weight gain, or early satiety.  He did have myalgias after starting Zetia and he discontinued on his own.  All other systems reviewed and are otherwise  negative except as noted above.  Physical Exam    VS:  BP 138/80 (BP Location: Left Arm, Patient Position: Sitting, Cuff Size: Normal)   Pulse (!) 51   Ht 6\' 1"  (1.854 m)   Wt 166 lb 4 oz (75.4 kg)   BMI 21.93 kg/m  , BMI Body mass index is 21.93 kg/m. GEN: Well nourished, well developed, in no acute distress.  HEENT: normal.  Neck: Supple, no JVD, carotid bruits, or masses. Cardiac: RRR, no murmurs, rubs, or gallops. No clubbing, cyanosis, edema.  Radials/DP/PT 2+ and equal bilaterally.  Respiratory:  Respirations regular and unlabored, slightly diminished bilaterally. GI: Soft, nontender, nondistended, BS + x 4. MS: no deformity or atrophy. Skin: warm and dry, no rash. Neuro:  Strength and sensation are intact. Psych: Normal affect.  Accessory Clinical Findings    ECG -sinus bradycardia, 51, left axis deviation, right bundle branch block, left anterior fascicular block  Lab Results  Component Value Date   CHOL 146 10/12/2017   HDL 35 (L) 10/12/2017   LDLCALC 89 10/12/2017   TRIG 110 10/12/2017   CHOLHDL 4.2 10/12/2017    Lab Results  Component Value Date   ALT 12 10/04/2017   AST 15 10/04/2017   ALKPHOS 79 10/04/2017   BILITOT 0.4 10/04/2017     Assessment & Plan    1.  Coronary artery disease: Status post non-STEMI in June 2014.  He is known to have moderate, nonobstructive RCA and circumflex disease with normal fractional flow reserves at that time.  He has remained relatively active, working 2 jobs without symptoms or limitations.  He remains on aspirin and statin therapy.  2.  Essential hypertension: Previously stable on lisinopril however, he developed angioedema in July and this was discontinued.  He is currently on amlodipine 5 mg daily and pressure is elevated today at 138/80.  He does not check it regularly at home.  I am going to increase amlodipine to 10 mg daily.  3.  Hyperlipidemia: LDL was 89 in February.  At that time, I added Zetia however, he  developed myalgias and subsequently discontinued.  He is not interested in a PCSK9i.  He will remain on Pravachol 40.  4.  Abdominal aortic aneurysm: Stable by duplex in February.  3.4 cm.  Follow-up next February.  5.  Tobacco abuse: Still smoking about half to two thirds of a pack a day.  Complete cessation advised.  He says he is trying to quit but is not currently interested in nicotine patch or Chantix.  He will let us know if he changes his mind.  He is currently using a toothpick as a substitute for a cigarette and thinks it is helping some.  6.  Disposition: Follow-up in 6 months or sooner if necessary.  Murray Hodgkins, NP 04/13/2018, 8:36 AM

## 2018-04-13 ENCOUNTER — Encounter: Payer: Self-pay | Admitting: Nurse Practitioner

## 2018-04-13 ENCOUNTER — Ambulatory Visit: Payer: 59 | Admitting: Nurse Practitioner

## 2018-04-13 VITALS — BP 138/80 | HR 51 | Ht 73.0 in | Wt 166.2 lb

## 2018-04-13 DIAGNOSIS — E785 Hyperlipidemia, unspecified: Secondary | ICD-10-CM | POA: Diagnosis not present

## 2018-04-13 DIAGNOSIS — I2510 Atherosclerotic heart disease of native coronary artery without angina pectoris: Secondary | ICD-10-CM | POA: Diagnosis not present

## 2018-04-13 DIAGNOSIS — Z720 Tobacco use: Secondary | ICD-10-CM

## 2018-04-13 DIAGNOSIS — I714 Abdominal aortic aneurysm, without rupture, unspecified: Secondary | ICD-10-CM

## 2018-04-13 DIAGNOSIS — I10 Essential (primary) hypertension: Secondary | ICD-10-CM

## 2018-04-13 MED ORDER — AMLODIPINE BESYLATE 10 MG PO TABS: 10.0000 mg | ORAL_TABLET | Freq: Every day | ORAL | 3 refills | Status: DC

## 2018-04-13 NOTE — Patient Instructions (Signed)
Medication Instructions:  Your physician has recommended you make the following change in your medication:  1- STOP taking Zetia. 2- INCREASE Amlodipine to 10 mg by mouth daily.   Labwork: NONE  Testing/Procedures: NONE  Follow-Up: Your physician recommends that you schedule a follow-up appointment in: [place].   If you need a refill on your cardiac medications before your next appointment, please call your pharmacy.

## 2018-06-02 ENCOUNTER — Other Ambulatory Visit: Payer: Self-pay | Admitting: Cardiovascular Disease

## 2019-03-02 ENCOUNTER — Other Ambulatory Visit: Payer: Self-pay | Admitting: Cardiovascular Disease

## 2019-03-06 ENCOUNTER — Telehealth: Payer: Self-pay | Admitting: Cardiovascular Disease

## 2019-03-06 NOTE — Telephone Encounter (Signed)
Pt overdue for 6 month f/u.  Please contact pt for future appointment last seen 03/2018. Pt is needing refills.

## 2019-03-06 NOTE — Telephone Encounter (Signed)
Attempted to scheule appt   No ans no vm

## 2019-03-06 NOTE — Telephone Encounter (Signed)
Patient scheduled 7/31 with R Dunn

## 2019-03-07 ENCOUNTER — Other Ambulatory Visit: Payer: Self-pay | Admitting: *Deleted

## 2019-03-07 MED ORDER — AMLODIPINE BESYLATE 10 MG PO TABS: 10.0000 mg | ORAL_TABLET | Freq: Every day | ORAL | 0 refills | Status: DC

## 2019-03-15 NOTE — Progress Notes (Signed)
Cardiology Office Note    Date:  03/16/2019   ID:  Dale Becker, DOB 1941-01-08, MRN [id number]  PCP:  Patient, No Pcp Per  Cardiologist:  Kathlyn Sacramento, MD  Electrophysiologist:  None   Chief Complaint: Follow up  History of Present Illness:   Dale Becker is a 78 y.o. 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   Patient was admitted to the hospital in 01/2013 with a NSTEMI. LHC showed moderate, nonobstructive disease in the LCx and proximal RCA. FFR in each area was normal. LVSF was normal as well. He was medically managed. Most recent abdominal ultrasound from 09/2017 showed stable AAA measuring 3.4 cm (prior 3.4 cm in 09/2016). He was last seen in the office in 03/2018 and was doing well. He was not interested in PCSK-9 inhibitor and was trying to quit tobacco.   He comes in doing very well today.  He denies any chest pain, shortness of breath, palpitations, dizziness, presyncope, or syncope.  No lower extremity swelling, abdominal distention, orthopnea, PND, early satiety.  He has been out of his amlodipine 5 mg daily (MAR listed 10 mg daily) for at least 2 weeks.  He does not check his blood pressure at home.  He continues to smoke at less than 1 pack daily and is uninterested in assistance with cessation.  He does not do any extraneous heavy lifting.  No falls since he was last seen.  No BRBPR or melena.  Energy remains stable.  He is tolerating pravastatin and Zetia without issues and has not ran out of these medicines.  He does not have any issues or concerns today.  Labs: 09/2017 - TC 146, TG 110, HDL 35, LDL 89, K+ 3.9, SCr 1.25, albumin 4.1, AST/ALT normal  Past Medical History:  Diagnosis Date  . AAA (abdominal aortic aneurysm) ([place])    a. 09/2016 Ao Duplex: 3.2 x 3.4 cm juxtarenal AAA - 3.1 cm in  length; b. 09/2017 Ao Duplex: largest Ao measurement 3.4cm. Overall stable.  . Brain tumor ([place])   . Coronary artery disease 01/2013   Presented in June of 2014 with non-ST elevation myocardial infarction(peak troponin of 22). Cardiac catheterization showed moderate nonobstructive disease in the left circumflex and proximal RCA with an FFR ratio of 0.88 and 0.90 respectively. Ejection fraction was 70% and hyperdynamic. MI was likely due to plaque rupture without obstructive disease.   Marland Kitchen History of echocardiogram    a. 02/2013 Echo: EF 55-60%, mild TR.  Marland Kitchen Hyperlipidemia   . Hypertension   . MI (myocardial infarction) ([place]) 01/2013  . Tobacco use     Past Surgical History:  Procedure Laterality Date  . BRAIN TUMOR EXCISION    . CARDIAC CATHETERIZATION  01/2013   [facility];EF 70 %    Current Medications: Current Meds  Medication Sig  . amLODipine (NORVASC) 10 MG tablet Take 1 tablet (10 mg total) by mouth daily. (Patient taking differently: Take 5 mg by mouth daily. )  . aspirin 81 MG tablet Take 81 mg by mouth daily.  Marland Kitchen ezetimibe (ZETIA) 10 MG tablet Take 1 tablet (10 mg total) by mouth daily.  Marland Kitchen NITROSTAT 0.4 MG SL tablet Place 0.4 mg under the tongue every 5 (five) minutes as needed.   . pravastatin (PRAVACHOL) 40 MG tablet TAKE 1 TABLET BY MOUTH EVERY EVENING  Allergies:   Atorvastatin, Lisinopril, Rosuvastatin, and Simvastatin   Social History   Socioeconomic History  . Marital status: Married    Spouse name: Not on file  . Number of children: Not on file  . Years of education: Not on file  . Highest education level: Not on file  Occupational History  . Not on file  Social Needs  . Financial resource strain: Not on file  . Food insecurity    Worry: Not on file    Inability: Not on file  . Transportation needs    Medical: Not on file    Non-medical: Not on file  Tobacco Use  . Smoking status: Current Every Day Smoker    Packs/day: 1.00    Years: 50.00    Pack years:  50.00    Types: Cigarettes  . Smokeless tobacco: Never Used  Substance and Sexual Activity  . Alcohol use: No  . Drug use: No  . Sexual activity: Not on file  Lifestyle  . Physical activity    Days per week: Not on file    Minutes per session: Not on file  . Stress: Not on file  Relationships  . Social Herbalist on phone: Not on file    Gets together: Not on file    Attends religious service: Not on file    Active member of club or organization: Not on file    Attends meetings of clubs or organizations: Not on file    Relationship status: Not on file  Other Topics Concern  . Not on file  Social History Narrative  . Not on file     Family History:  The patient's Family history is unknown by patient.  ROS:   Review of Systems  Constitutional: Negative for chills, diaphoresis, fever, malaise/fatigue and weight loss.  HENT: Negative for congestion.   Eyes: Negative for discharge and redness.  Respiratory: Negative for cough, hemoptysis, sputum production, shortness of breath and wheezing.   Cardiovascular: Negative for chest pain, palpitations, orthopnea, claudication, leg swelling and PND.  Gastrointestinal: Negative for abdominal pain, blood in stool, heartburn, melena, nausea and vomiting.  Genitourinary: Negative for hematuria.  Musculoskeletal: Negative for falls and myalgias.  Skin: Negative for rash.  Neurological: Negative for dizziness, tingling, tremors, sensory change, speech change, focal weakness, loss of consciousness and weakness.  Endo/Heme/Allergies: Does not bruise/bleed easily.  Psychiatric/Behavioral: Negative for substance abuse. The patient is not nervous/anxious.   All other systems reviewed and are negative.    EKGs/Labs/Other Studies Reviewed:    Studies reviewed were summarized above. The additional studies were reviewed today: As above  EKG:  EKG is ordered today.  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  Recent Lipid Panel    Component Value Date/Time   CHOL 146 10/12/2017 1014   CHOL 155 10/04/2017 0853   CHOL 162 01/26/2013 0402   TRIG 110 10/12/2017 1014   TRIG 76 01/26/2013 0402   HDL 35 (L) 10/12/2017 1014   HDL 32 (L) 10/04/2017 0853   HDL 39 (L) 01/26/2013 0402   CHOLHDL 4.2 10/12/2017 1014   VLDL 22 10/12/2017 1014   VLDL 15 01/26/2013 0402   LDLCALC 89 10/12/2017 1014   LDLCALC 101 (H) 10/04/2017 0853   LDLCALC 108 (H) 01/26/2013 0402    PHYSICAL EXAM:  VS:  BP 124/90 (BP Location: Left Arm, Patient Position: Sitting, Cuff Size: Normal)   Pulse (!) 56   Temp (!) 97.1 F (36.2 C)   Ht 6' (1.829 m)   Wt 167 lb 12 oz (76.1 kg)   SpO2 98%   BMI 22.75 kg/m   BMI: Body mass index is 22.75 kg/m.  Physical Exam  Constitutional: He is oriented to person, place, and time. He appears well-developed and well-nourished.  HENT:  Head: Normocephalic and atraumatic.  Eyes: Right eye exhibits no discharge. Left eye exhibits no discharge.  Neck: Normal range of motion. No JVD present.  Cardiovascular: Regular rhythm, S1 normal, S2 normal and normal heart sounds. Bradycardia present. Exam reveals no distant heart sounds, no friction rub, no midsystolic click and no opening snap.  No murmur heard. Pulses:      Posterior tibial pulses are 2+ on the right side and 2+ on the left side.  Pulmonary/Chest: Effort normal and breath sounds normal. No respiratory distress. He has no decreased breath sounds. He has no wheezes. He has no rales. He exhibits no tenderness.  Abdominal: Soft. He exhibits no distension. There is no abdominal tenderness.  Musculoskeletal:        General: No edema.  Neurological: He is alert and oriented to person, place, and time.  Skin: Skin is warm and dry. No cyanosis. Nails show no clubbing.   Psychiatric: He has a normal mood and affect. His speech is normal and behavior is normal. Judgment and thought content normal.    Wt Readings from Last 3 Encounters:  03/16/19 167 lb 12 oz (76.1 kg)  04/13/18 166 lb 4 oz (75.4 kg)  10/04/17 165 lb 12 oz (75.2 kg)     ASSESSMENT & PLAN:   1. CAD involving the native coronary arteries without angina: He is doing well without any symptoms concerning for angina.  Continue aspirin 81 mg daily along with pravastatin and Zetia.  Not on beta-blocker secondary to underlying bradycardia.  Aggressive risk factor modification including complete smoking cessation is recommended.  No plans for ischemic evaluation at this time.  2. AAA: Most recent abdominal ultrasound from 09/2017 showed stable AAA at 3.4 cm when compared to study from 09/2016.  Order abdominal ultrasound.  Optimal blood pressure, lipid control, and tobacco cessation are recommended.  Aneurysm precautions discussed in detail.  3. HTN: Blood pressure is mildly elevated today at 124/90, especially in the setting of known stable AAA.  Patient has been out of amlodipine 5 mg daily for the past 2 weeks.  Of note, patient was previously listed as taking 10 mg daily though reports he was only taking 5 mg daily.  We will resume amlodipine 5 mg daily.  4. HLD: Most recent LDL of 89 from 09/2017.  Remains on pravastatin and Zetia.  Intolerant to high intensity statins.  Check CMP and lipid panel.  5. Bradycardia: No new issue.  Asymptomatic.  Check TSH and BMP to evaluate for hyperkalemia.  Avoid beta blockers and non-dihydropyridine calcium channel blockers.  6. Tobacco abuse: Unfortunately, he continues to smoke less than 1/2 pack daily.  He is not interested in medical assistance in quitting.  He indicates he will taper on his own.  Complete cessation is recommended.  Disposition: F/u with Dr. Fletcher Anon or an APP in 12 months, sooner if needed.   Medication Adjustments/Labs and Tests Ordered:  Current medicines are reviewed at length with the patient today.  Concerns regarding medicines are outlined above. Medication  changes, Labs and Tests ordered today are summarized above and listed in the Patient Instructions accessible in Encounters.   Signed, Christell Faith, PA-C 03/16/2019 8:31 AM     [facility] [address] [place] [address] [place], [place] [address] [phone]

## 2019-03-16 ENCOUNTER — Encounter: Payer: Self-pay | Admitting: Physician Assistant

## 2019-03-16 ENCOUNTER — Ambulatory Visit (INDEPENDENT_AMBULATORY_CARE_PROVIDER_SITE_OTHER): Payer: 59 | Admitting: Physician Assistant

## 2019-03-16 ENCOUNTER — Other Ambulatory Visit: Payer: Self-pay

## 2019-03-16 VITALS — BP 124/90 | HR 56 | Temp 97.1°F | Ht 72.0 in | Wt 167.8 lb

## 2019-03-16 DIAGNOSIS — I10 Essential (primary) hypertension: Secondary | ICD-10-CM | POA: Diagnosis not present

## 2019-03-16 DIAGNOSIS — I714 Abdominal aortic aneurysm, without rupture, unspecified: Secondary | ICD-10-CM

## 2019-03-16 DIAGNOSIS — R001 Bradycardia, unspecified: Secondary | ICD-10-CM

## 2019-03-16 DIAGNOSIS — E785 Hyperlipidemia, unspecified: Secondary | ICD-10-CM

## 2019-03-16 DIAGNOSIS — Z720 Tobacco use: Secondary | ICD-10-CM

## 2019-03-16 DIAGNOSIS — I2510 Atherosclerotic heart disease of native coronary artery without angina pectoris: Secondary | ICD-10-CM | POA: Diagnosis not present

## 2019-03-16 MED ORDER — AMLODIPINE BESYLATE 5 MG PO TABS: 5.0000 mg | ORAL_TABLET | Freq: Every day | ORAL | 3 refills | Status: DC

## 2019-03-16 NOTE — Patient Instructions (Signed)
Medication Instructions:  - Your physician has recommended you make the following change in your medication:   1) Restart norvasc (amlodipine) at 5 mg- take 1 tablet by mouth once daily  If you need a refill on your cardiac medications before your next appointment, please call your pharmacy.   Lab work: - Your physician recommends that you have lab work today: TSH/ CBC/ CMET/ Lipid  If you have labs (blood work) drawn today and your tests are completely normal, you will receive your results only by: Marland Kitchen MyChart Message (if you have MyChart) OR . A paper copy in the mail If you have any lab test that is abnormal or we need to change your treatment, we will call you to review the results.  Testing/Procedures: - Your physician has requested that you have an abdominal aorta duplex. During this test, an ultrasound is used to evaluate the aorta. Allow 30 minutes for this exam. Do not eat after midnight the day before and avoid carbonated beverages  Follow-Up: At [facility], you and your health needs are our priority.  As part of our continuing mission to provide you with exceptional heart care, we have created designated Provider Care Teams.  These Care Teams include your primary Cardiologist (physician) and Advanced Practice Providers (APPs -  Physician Assistants and Nurse Practitioners) who all work together to provide you with the care you need, when you need it.  You will need a follow up appointment in 1 year (late July/ early August 2021). Please call our office 2 months in advance to schedule this appointment.  (Call in early May 2021 to schedule)  You may see Kathlyn Sacramento, MD or one of the following Advanced Practice Providers on your designated Care Team:   Murray Hodgkins, NP Christell Faith, PA-C . Marrianne Mood, PA-C  Any Other Special Instructions Will Be Listed Below (If Applicable). - N/A

## 2019-03-17 LAB — COMPREHENSIVE METABOLIC PANEL
ALT: 7 IU/L (ref 0–44)
AST: 15 IU/L (ref 0–40)
Albumin/Globulin Ratio: 1.5 (ref 1.2–2.2)
Albumin: 4.3 g/dL (ref 3.7–4.7)
Alkaline Phosphatase: 73 IU/L (ref 39–117)
BUN/Creatinine Ratio: 7 — ABNORMAL LOW (ref 10–24)
BUN: 8 mg/dL (ref 8–27)
Bilirubin Total: 0.4 mg/dL (ref 0.0–1.2)
CO2: 23 mmol/L (ref 20–29)
Calcium: 9.3 mg/dL (ref 8.6–10.2)
Chloride: 103 mmol/L (ref 96–106)
Creatinine, Ser: 1.23 mg/dL (ref 0.76–1.27)
GFR calc Af Amer: 66 mL/min/{1.73_m2} (ref >59)
GFR calc non Af Amer: 57 mL/min/{1.73_m2} — ABNORMAL LOW (ref >59)
Globulin, Total: 2.9 g/dL (ref 1.5–4.5)
Glucose: 91 mg/dL (ref 65–99)
Potassium: 4.1 mmol/L (ref 3.5–5.2)
Sodium: 141 mmol/L (ref 134–144)
Total Protein: 7.2 g/dL (ref 6.0–8.5)

## 2019-03-17 LAB — CBC WITH DIFFERENTIAL/PLATELET
Basophils Absolute: 0.1 10*3/uL (ref 0.0–0.2)
Basos: 2 %
EOS (ABSOLUTE): 0.1 10*3/uL (ref 0.0–0.4)
Eos: 3 %
Hematocrit: 42 % (ref 37.5–51.0)
Hemoglobin: 14.6 g/dL (ref 13.0–17.7)
Immature Grans (Abs): 0 10*3/uL (ref 0.0–0.1)
Immature Granulocytes: 0 %
Lymphocytes Absolute: 1.3 10*3/uL (ref 0.7–3.1)
Lymphs: 41 %
MCH: 31.2 pg (ref 26.6–33.0)
MCHC: 34.8 g/dL (ref 31.5–35.7)
MCV: 90 fL (ref 79–97)
Monocytes Absolute: 0.4 10*3/uL (ref 0.1–0.9)
Monocytes: 11 %
Neutrophils Absolute: 1.3 10*3/uL — ABNORMAL LOW (ref 1.4–7.0)
Neutrophils: 43 %
Platelets: 194 10*3/uL (ref 150–450)
RBC: 4.68 x10E6/uL (ref 4.14–5.80)
RDW: 13.9 % (ref 11.6–15.4)
WBC: 3.1 10*3/uL — ABNORMAL LOW (ref 3.4–10.8)

## 2019-03-17 LAB — LIPID PANEL
Chol/HDL Ratio: 5.1 ratio — ABNORMAL HIGH (ref 0.0–5.0)
Cholesterol, Total: 153 mg/dL (ref 100–199)
HDL: 30 mg/dL — ABNORMAL LOW (ref >39)
LDL Calculated: 103 mg/dL — ABNORMAL HIGH (ref 0–99)
Triglycerides: 98 mg/dL (ref 0–149)
VLDL Cholesterol Cal: 20 mg/dL (ref 5–40)

## 2019-03-17 LAB — TSH: TSH: 2.52 u[IU]/mL (ref 0.450–4.500)

## 2019-03-19 ENCOUNTER — Encounter: Payer: Self-pay | Admitting: *Deleted

## 2019-03-20 ENCOUNTER — Other Ambulatory Visit: Payer: Self-pay

## 2019-03-20 ENCOUNTER — Telehealth: Payer: Self-pay | Admitting: *Deleted

## 2019-03-20 ENCOUNTER — Encounter: Payer: Self-pay | Admitting: *Deleted

## 2019-03-20 ENCOUNTER — Ambulatory Visit (INDEPENDENT_AMBULATORY_CARE_PROVIDER_SITE_OTHER): Payer: 59

## 2019-03-20 DIAGNOSIS — I714 Abdominal aortic aneurysm, without rupture, unspecified: Secondary | ICD-10-CM

## 2019-03-20 NOTE — Telephone Encounter (Signed)
Patient called back and verbalized understanding of recommendations and results. He is aware I have mailed him this information and the lab results as well. He was appreciative.

## 2019-03-20 NOTE — Telephone Encounter (Signed)
-----   Message from Rise Mu, PA-C sent at 03/20/2019  9:17 AM EDT ----- Abdominal ultrasound shows unchanged AAA measuring 3.4 cm.   Recommendations: Mainstays of therapy for aneurysms include good blood pressure control, healthy lifestyle, and avoiding fluoroquinolone antibiotic medications (such as those in the "Cipro" class, ending in "floxacin") due to risk of damage to the aorta. Since aneurysms can run in families, patient should discuss diagnosis with first degree relatives as they may need to be screened for this. Regular mild-moderate physical exercise is OK but avoid heavy lifting/weight lifting over 30lbs, chopping wood, shoveling snow or digging heavy earth with a shovel.  Patient can reference this flyer (https://[email or web address].pdf) for more information, keeping in mind of some of this information also pertains to people who have had surgery for this.  Recommend follow-up study in 12 months.

## 2019-03-20 NOTE — Telephone Encounter (Signed)
No answer. Left message to call back.   Flyer reference in result note printed. Mailed this flyer as well as a letter with patient results to him for reference.

## 2019-06-07 ENCOUNTER — Telehealth: Payer: Self-pay | Admitting: Cardiovascular Disease

## 2019-06-07 ENCOUNTER — Other Ambulatory Visit: Payer: Self-pay | Admitting: Cardiovascular Disease

## 2019-06-07 NOTE — Telephone Encounter (Signed)
pravastatin (PRAVACHOL) 40 MG tablet 30 tablet 3 06/07/2019    Sig: TAKE 1 TABLET BY MOUTH ONCE DAILY IN THE EVENING   Sent to pharmacy as: pravastatin (PRAVACHOL) 40 MG tablet   E-Prescribing Status: Receipt confirmed by pharmacy (06/07/2019 7:33 AM EDT)   Pharmacy  [facility] [place], [place]

## 2019-06-07 NOTE — Telephone Encounter (Signed)
°*  STAT* If patient is at the pharmacy, call can be transferred to refill team.   1. Which medications need to be refilled? (please list name of each medication and dose if known)   Pravastatin   2. Which pharmacy/location (including street and city if local pharmacy) is medication to be sent to?    [place]   3. Do they need a 30 day or 90 day supply? [place]

## 2019-07-09 ENCOUNTER — Other Ambulatory Visit: Payer: Self-pay | Admitting: Physician Assistant

## 2019-07-09 DIAGNOSIS — I714 Abdominal aortic aneurysm, without rupture, unspecified: Secondary | ICD-10-CM

## 2020-02-12 ENCOUNTER — Other Ambulatory Visit: Payer: Self-pay | Admitting: Cardiovascular Disease

## 2020-02-12 MED ORDER — PRAVASTATIN SODIUM 40 MG PO TABS: 40.0000 mg | ORAL_TABLET | Freq: Every evening | ORAL | 0 refills | Status: DC

## 2020-02-12 NOTE — Telephone Encounter (Signed)
Requested Prescriptions   Signed Prescriptions Disp Refills   pravastatin (PRAVACHOL) 40 MG tablet 90 tablet 0    Sig: Take 1 tablet (40 mg total) by mouth every evening.    Authorizing Provider: Rise Mu    Ordering User: Raelene Bott, Jc Veron L

## 2020-02-12 NOTE — Telephone Encounter (Signed)
*  STAT* If patient is at the pharmacy, call can be transferred to refill team.   1. Which medications need to be refilled? (please list name of each medication and dose if known) pravastatin 40 mg daily  2. Which pharmacy/location (including street and city if local pharmacy) is medication to be sent to? [facility] [address]  3. Do they need a 30 day or 90 day supply? [place]

## 2020-03-07 NOTE — Progress Notes (Signed)
Cardiology Office Note    Date:  03/18/2020   ID:  Dale Becker, DOB [birth year], MRN [id number]  PCP:  Patient, No Pcp Per  Cardiologist:  Kathlyn Sacramento, MD  Electrophysiologist:  None   Chief Complaint: Follow-up  History of Present Illness:   Dale Becker is a 79 y.o. 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   He was admitted to the hospital in 01/2013 with a NSTEMI. LHC showed moderate, nonobstructive disease in the LCx and proximal RCA. FFR in each area was normal. LVSF was normal as well. He was medically managed. Abdominal ultrasound from 09/2017 showed stable AAA measuring 3.4 cm (prior 3.4 cm in 09/2016). He was seen in the office in 03/2018 and was doing well. He was not interested in PCSK-9 inhibitor and was trying to quit tobacco.  He was last seen in the office in 02/2019 and was doing well from a cardiac perspective.  He had been out of his amlodipine for at least 2 weeks though was noted to have well controlled BP in the office.  He continued to smoke less than 1 pack daily and was not interested in assistance with quitting.  He was tolerating pravastatin and Zetia.  Abdominal ultrasound showed an unchanged AAA measuring 3.4 cm with recommendation to follow-up imaging in 03/2020.  He comes in doing well from a cardiac perspective.  No chest pain, palpitations, dyspnea, dizziness, presyncope, syncope, lower extremity swelling, abdominal distention, orthopnea, PND, early satiety.  He does not check his blood pressure at home.  His blood pressure is mildly elevated in the office today at 138/90 though he attributes this to having recently smoked a cigarette and has not taken his amlodipine yet.  His weight is down 4 pounds when compared to his visit last year.  He remains active working in the yard though  does not perform strenuous activities in the setting of his underlying AAA.  He continues to smoke less than half pack per day and is not interested in assistance with quitting at this time.  He does not have any issues or concerns currently.   Labs independently reviewed: 02/2019 - TC 153, TG 98, HDL 30, LDL 103, BUN 8, serum creatinine 1.23, potassium 4.1, albumin 4.3, AST/ALT normal, Hgb 14.6, PLT 194, TSH normal  Past Medical History:  Diagnosis Date  . AAA (abdominal aortic aneurysm) ([place])    a. 09/2016 Ao Duplex: 3.2 x 3.4 cm juxtarenal AAA - 3.1 cm in length; b. 09/2017 Ao Duplex: largest Ao measurement 3.4cm. Overall stable.  . Brain tumor ([place])   . Coronary artery disease 01/2013   Presented in June of 2014 with non-ST elevation myocardial infarction(peak troponin of 22). Cardiac catheterization showed moderate nonobstructive disease in the left circumflex and proximal RCA with an FFR ratio of 0.88 and 0.90 respectively. Ejection fraction was 70% and hyperdynamic. MI was likely due to plaque rupture without obstructive disease.   Marland Kitchen History of echocardiogram    a. 02/2013 Echo: EF 55-60%, mild TR.  Marland Kitchen Hyperlipidemia   . Hypertension   . MI (myocardial infarction) ([place]) 01/2013  . Tobacco use     Past Surgical History:  Procedure Laterality Date  . BRAIN TUMOR EXCISION    . CARDIAC CATHETERIZATION  01/2013   [facility];EF 70 %    Current Medications: Current Meds  Medication Sig  . amLODipine (NORVASC) 5 MG tablet Take 1 tablet (5 mg total) by mouth daily.  Marland Kitchen aspirin 81 MG tablet Take 81 mg by mouth daily.  Marland Kitchen NITROSTAT 0.4 MG SL tablet Place 0.4 mg under the tongue every 5 (five) minutes as needed.   . pravastatin (PRAVACHOL) 40 MG tablet Take 1 tablet (40 mg total) by mouth every evening.    Allergies:   Atorvastatin, Lisinopril, Rosuvastatin, and Simvastatin   Social History   Socioeconomic History  . Marital status: Married    Spouse name: Not on file  . Number of children:  Not on file  . Years of education: Not on file  . Highest education level: Not on file  Occupational History  . Not on file  Tobacco Use  . Smoking status: Current Every Day Smoker    Packs/day: 1.00    Years: 50.00    Pack years: 50.00    Types: Cigarettes  . Smokeless tobacco: Never Used  Vaping Use  . Vaping Use: Never used  Substance and Sexual Activity  . Alcohol use: No  . Drug use: No  . Sexual activity: Not on file  Other Topics Concern  . Not on file  Social History Narrative  . Not on file   Social Determinants of Health   Financial Resource Strain:   . Difficulty of Paying Living Expenses:   Food Insecurity:   . Worried About Charity fundraiser in the Last Year:   . Arboriculturist in the Last Year:   Transportation Needs:   . Film/video editor (Medical):   Marland Kitchen Lack of Transportation (Non-Medical):   Physical Activity:   . Days of Exercise per Week:   . Minutes of Exercise per Session:   Stress:   . Feeling of Stress :   Social Connections:   . Frequency of Communication with Friends and Family:   . Frequency of Social Gatherings with Friends and Family:   . Attends Religious Services:   . Active Member of Clubs or Organizations:   . Attends Archivist Meetings:   Marland Kitchen Marital Status:      Family History:  The patient's Family history is unknown by patient.  ROS:   Review of Systems  Constitutional: Negative for chills, diaphoresis, fever, malaise/fatigue and weight loss.  HENT: Negative for congestion.   Eyes: Negative for discharge and redness.  Respiratory: Negative for cough, sputum production, shortness of breath and wheezing.   Cardiovascular: Negative for chest pain, palpitations, orthopnea, claudication, leg swelling and PND.  Gastrointestinal: Negative for abdominal pain, heartburn, nausea and vomiting.  Musculoskeletal: Negative for falls and myalgias.  Skin: Negative for rash.  Neurological: Negative for dizziness, tingling,  tremors, sensory change, speech change, focal weakness, loss of consciousness and weakness.  Endo/Heme/Allergies: Does not bruise/bleed easily.  Psychiatric/Behavioral: Negative for substance abuse. The patient is not nervous/anxious.   All other systems reviewed and are negative.    EKGs/Labs/Other Studies Reviewed:    Studies reviewed were summarized above. The additional studies were reviewed today: As above.   EKG:  EKG is ordered today.  The EKG ordered today demonstrates sinus bradycardia, 54 bpm, left axis deviation, RBBB, unchanged from prior  Recent Labs: No results found for requested labs within last 8760 hours.  Recent Lipid Panel    Component Value Date/Time   CHOL 153 03/16/2019 0855   CHOL 162 01/26/2013 0402   TRIG 98 03/16/2019 0855   TRIG 76 01/26/2013  0402   HDL 30 (L) 03/16/2019 0855   HDL 39 (L) 01/26/2013 0402   CHOLHDL 5.1 (H) 03/16/2019 0855   CHOLHDL 4.2 10/12/2017 1014   VLDL 22 10/12/2017 1014   VLDL 15 01/26/2013 0402   LDLCALC 103 (H) 03/16/2019 0855   LDLCALC 108 (H) 01/26/2013 0402    PHYSICAL EXAM:    VS:  BP 138/90 (BP Location: Left Arm, Patient Position: Sitting, Cuff Size: Normal)   Pulse (!) 54   Ht 6' (1.829 m)   Wt 163 lb 3.2 oz (74 kg)   SpO2 94%   BMI 22.13 kg/m   BMI: Body mass index is 22.13 kg/m.  Physical Exam Constitutional:      Appearance: He is well-developed.  HENT:     Head: Normocephalic and atraumatic.  Eyes:     General:        Right eye: No discharge.        Left eye: No discharge.  Neck:     Vascular: No JVD.  Cardiovascular:     Rate and Rhythm: Normal rate and regular rhythm.     Pulses: No midsystolic click and no opening snap.          Posterior tibial pulses are 2+ on the right side and 2+ on the left side.     Heart sounds: Normal heart sounds, S1 normal and S2 normal. Heart sounds not distant. No murmur heard.  No friction rub.  Pulmonary:     Effort: Pulmonary effort is normal. No respiratory  distress.     Breath sounds: Normal breath sounds. No decreased breath sounds, wheezing or rales.  Chest:     Chest wall: No tenderness.  Abdominal:     General: There is no distension.     Palpations: Abdomen is soft. There is no pulsatile mass.     Tenderness: There is no abdominal tenderness.  Musculoskeletal:     Cervical back: Normal range of motion.  Skin:    General: Skin is warm and dry.     Nails: There is no clubbing.  Neurological:     Mental Status: He is alert and oriented to person, place, and time.  Psychiatric:        Speech: Speech normal.        Behavior: Behavior normal.        Thought Content: Thought content normal.        Judgment: Judgment normal.     Wt Readings from Last 3 Encounters:  03/18/20 163 lb 3.2 oz (74 kg)  03/16/19 167 lb 12 oz (76.1 kg)  04/13/18 166 lb 4 oz (75.4 kg)     ASSESSMENT & PLAN:   1. CAD involving the native coronary arteries without angina: He is doing well without any symptoms concerning for angina.  Continue aspirin 81 mg daily along with amlodipine, pravastatin, and Zetia.  He is not on a beta-blocker secondary to underlying bradycardia.  Aggressive risk factor modification including smoking cessation is recommended.  No plans for ischemic evaluation at this time.  2. AAA: Stable AAA measuring 3.4 cm in 2020.  Update abdominal ultrasound.  Optimal BP, heart rate, lipid control, and tobacco cessation recommended.  Precautions discussed.  3. HTN: Blood pressure is mildly elevated in the office today though he attributes this to having recently smoked a cigarette and has not yet taken his amlodipine 5 mg daily.  He prefers to not titrate amlodipine at this time.  Recommend smoking cessation, low-sodium diet, and  current dose amlodipine.  4. HLD: [id number] with goal being less than 70.  He is intolerant to high intensity statins secondary to myalgias.  Check CMP and lipid panel.  5. Tobacco use: Complete cessation is recommended.   He is uninterested in assistance with this at this time.  He prefers to attempt cold [place].  Disposition: F/u with Dr. Fletcher Anon or an APP in 12 months, sooner if needed.   Medication Adjustments/Labs and Tests Ordered: Current medicines are reviewed at length with the patient today.  Concerns regarding medicines are outlined above. Medication changes, Labs and Tests ordered today are summarized above and listed in the Patient Instructions accessible in Encounters.   Signed, Christell Faith, PA-C 03/18/2020 9:36 AM     [facility] [address] [place] [address] [place], [place] [address] [phone]

## 2020-03-18 ENCOUNTER — Ambulatory Visit (INDEPENDENT_AMBULATORY_CARE_PROVIDER_SITE_OTHER): Payer: 59 | Admitting: Physician Assistant

## 2020-03-18 ENCOUNTER — Other Ambulatory Visit: Payer: Self-pay

## 2020-03-18 ENCOUNTER — Encounter: Payer: Self-pay | Admitting: Physician Assistant

## 2020-03-18 VITALS — BP 138/90 | HR 54 | Ht 72.0 in | Wt 163.2 lb

## 2020-03-18 DIAGNOSIS — M791 Myalgia, unspecified site: Secondary | ICD-10-CM

## 2020-03-18 DIAGNOSIS — I714 Abdominal aortic aneurysm, without rupture, unspecified: Secondary | ICD-10-CM

## 2020-03-18 DIAGNOSIS — I10 Essential (primary) hypertension: Secondary | ICD-10-CM | POA: Diagnosis not present

## 2020-03-18 DIAGNOSIS — E785 Hyperlipidemia, unspecified: Secondary | ICD-10-CM | POA: Diagnosis not present

## 2020-03-18 DIAGNOSIS — I2510 Atherosclerotic heart disease of native coronary artery without angina pectoris: Secondary | ICD-10-CM

## 2020-03-18 DIAGNOSIS — Z720 Tobacco use: Secondary | ICD-10-CM

## 2020-03-18 MED ORDER — AMLODIPINE BESYLATE 5 MG PO TABS: 5.0000 mg | ORAL_TABLET | Freq: Every day | ORAL | 3 refills | Status: DC

## 2020-03-18 NOTE — Patient Instructions (Signed)
Medication Instructions:  Your physician recommends that you continue on your current medications as directed. Please refer to the Current Medication list given to you today.  *If you need a refill on your cardiac medications before your next appointment, please call your pharmacy*   Lab Work: Your physician recommends that you return for lab work in: TODAY - LIPID, CMET.   If you have labs (blood work) drawn today and your tests are completely normal, you will receive your results only by: Marland Kitchen MyChart Message (if you have MyChart) OR . A paper copy in the mail If you have any lab test that is abnormal or we need to change your treatment, we will call you to review the results.   Testing/Procedures: Your physician has requested that you have an abdominal aorta duplex. During this test, an ultrasound is used to evaluate the aorta. Allow 30 minutes for this exam. Do not eat after midnight the day before and avoid carbonated beverages  AORTA  No food after 11PM the night before.  Water is OK. (Don't drink liquids if you have been instructed not to for ANOTHER test).  Take two Extra-Strength Gas-X capsules at bedtime the night before test.   Take an additional two Extra-Strength Gas-X capsules three (3) hours before the test or first thing in the morning.    Avoid foods that produce bowel gas, for 24 hours prior to exam (see below).    No breakfast, no chewing gum, no smoking or carbonated beverages.  Patient may take morning medications with water.  Come in for test at least 15 minutes early to register.   Follow-Up: At [facility], you and your health needs are our priority.  As part of our continuing mission to provide you with exceptional heart care, we have created designated Provider Care Teams.  These Care Teams include your primary Cardiologist (physician) and Advanced Practice Providers (APPs -  Physician Assistants and Nurse Practitioners) who all work together to provide  you with the care you need, when you need it.  We recommend signing up for the patient portal called "MyChart".  Sign up information is provided on this After Visit Summary.  MyChart is used to connect with patients for Virtual Visits (Telemedicine).  Patients are able to view lab/test results, encounter notes, upcoming appointments, etc.  Non-urgent messages can be sent to your provider as well.   To learn more about what you can do with MyChart, go to [email or web address].    Your next appointment:   12 month(s)  The format for your next appointment:   In Person  Provider:    You may see Kathlyn Sacramento, MD or one of the following Advanced Practice Providers on your designated Care Team:    Murray Hodgkins, NP  Christell Faith, PA-C  Marrianne Mood, PA-C

## 2020-03-19 ENCOUNTER — Telehealth: Payer: Self-pay

## 2020-03-19 LAB — COMPREHENSIVE METABOLIC PANEL
ALT: 10 IU/L (ref 0–44)
AST: 20 IU/L (ref 0–40)
Albumin/Globulin Ratio: 1.3 (ref 1.2–2.2)
Albumin: 4.3 g/dL (ref 3.7–4.7)
Alkaline Phosphatase: 88 IU/L (ref 48–121)
BUN/Creatinine Ratio: 9 — ABNORMAL LOW (ref 10–24)
BUN: 10 mg/dL (ref 8–27)
Bilirubin Total: 0.5 mg/dL (ref 0.0–1.2)
CO2: 20 mmol/L (ref 20–29)
Calcium: 9.7 mg/dL (ref 8.6–10.2)
Chloride: 101 mmol/L (ref 96–106)
Creatinine, Ser: 1.17 mg/dL (ref 0.76–1.27)
GFR calc Af Amer: 69 mL/min/{1.73_m2} (ref >59)
GFR calc non Af Amer: 60 mL/min/{1.73_m2} (ref >59)
Globulin, Total: 3.4 g/dL (ref 1.5–4.5)
Glucose: 82 mg/dL (ref 65–99)
Potassium: 4.2 mmol/L (ref 3.5–5.2)
Sodium: 139 mmol/L (ref 134–144)
Total Protein: 7.7 g/dL (ref 6.0–8.5)

## 2020-03-19 LAB — LIPID PANEL
Chol/HDL Ratio: 5.5 ratio — ABNORMAL HIGH (ref 0.0–5.0)
Cholesterol, Total: 160 mg/dL (ref 100–199)
HDL: 29 mg/dL — ABNORMAL LOW (ref >39)
LDL Chol Calc (NIH): 108 mg/dL — ABNORMAL HIGH (ref 0–99)
Triglycerides: 128 mg/dL (ref 0–149)
VLDL Cholesterol Cal: 23 mg/dL (ref 5–40)

## 2020-03-19 NOTE — Telephone Encounter (Signed)
-----   Message from Rise Mu, PA-C sent at 03/19/2020  7:18 AM EDT ----- Random glucose normal.Renal function normal.Potassium at goal.Liver function normal. LDL cholesterol is stable though remains above goal  Recommendations: -If he is agreeable, we can refer him to the lipid clinic for consideration of PCSK9 inhibitor in the setting of underlying coronary disease with LDL above goal and high intensity statin intolerance

## 2020-03-19 NOTE — Telephone Encounter (Signed)
Attempted to call patient. LMTCB 03/19/2020   

## 2020-03-24 NOTE — Telephone Encounter (Signed)
Attempted to call patient. LMTCB 03/24/2020

## 2020-04-01 NOTE — Telephone Encounter (Signed)
Attempted to call patient. LMTCB 04/01/2020  3rd attempt.  Letter sent.   Dear Mr. Kathi Ludwig,   ?   This letter is to inform you to call the office for multiple missed calls to discuss results. We have made 3 attempts to reach you by phone @ [phone] and left multiple messages.   ?   Please return call to clinic so we can discuss your plan of care. If you would like to update your account with any new numbers you can do so at that time.    ?   Thank you for allowing [place] to care for you.   ?   ?   Sincerely,   ?   Yarelin Reichardt RN

## 2020-04-08 NOTE — Telephone Encounter (Signed)
Spoke with patient and reviewed results and recommendations. He verbalized understanding but does not want referral to lipid clinic at this time. He reports that he has not taken medications as ordered but that he will start taking it more regular to see if that will help. Advised that if he should change his mind about the referral to the lipid clinic to please give [place] a call back and we can set that up for him. He verbalized understanding with no further questions at this time.

## 2020-04-08 NOTE — Telephone Encounter (Signed)
Patient calling back for results, states the number on file is the correct number

## 2020-04-29 ENCOUNTER — Other Ambulatory Visit: Payer: Self-pay

## 2020-04-29 ENCOUNTER — Ambulatory Visit (INDEPENDENT_AMBULATORY_CARE_PROVIDER_SITE_OTHER): Payer: 59

## 2020-04-29 DIAGNOSIS — I714 Abdominal aortic aneurysm, without rupture, unspecified: Secondary | ICD-10-CM

## 2020-04-30 ENCOUNTER — Telehealth: Payer: Self-pay

## 2020-04-30 DIAGNOSIS — E785 Hyperlipidemia, unspecified: Secondary | ICD-10-CM

## 2020-04-30 DIAGNOSIS — I2510 Atherosclerotic heart disease of native coronary artery without angina pectoris: Secondary | ICD-10-CM

## 2020-04-30 DIAGNOSIS — I714 Abdominal aortic aneurysm, without rupture, unspecified: Secondary | ICD-10-CM

## 2020-04-30 NOTE — Telephone Encounter (Signed)
Attempted to call patient. [facility] 04/30/2020

## 2020-04-30 NOTE — Telephone Encounter (Signed)
-----   Message from Rise Mu, PA-C sent at 04/29/2020  5:01 PM EDT ----- Abdominal ultrasound showed slight progression of his AAA, now measuring 3.7 cm at the largest area with prior imaging measuring 3.4 cm in 03/2019. Follow up abdominal ultrasound in 12 months. Recommend optimal BP and lipid control along with complete smoking cessation. His BP was mildly elevated in the office when I last saw him, though he attributed this to having recently smoked and not having taken his amlodipine yet. If he continues to note BP > 130/80, please let us know so we can adjust his medications. His LDL was also elevated and above goal. He refused lipid clinic referral at that time indicating he was not taking pravastatin as directed. He should continue pravastatin daily and follow up with a fasting lipid panel and LFT in 2 months time. If his LDL remains above goal at that time, we can revisit the lipid clinic. Of note, he is intolerant to moderate and high intensity statins.

## 2020-05-01 NOTE — Telephone Encounter (Signed)
Tried Camera operator. Busy both times and unable to leave a message.

## 2020-05-05 NOTE — Telephone Encounter (Signed)
Attempted to contact the patient. Unable to lmtcb.

## 2020-05-06 ENCOUNTER — Encounter: Payer: Self-pay | Admitting: *Deleted

## 2020-05-06 NOTE — Telephone Encounter (Signed)
No answer. Left message to call back.  Letter with results and recommendations mailed to patient.   Repeat abdominal ultrasound for 12 months and repeat lab work entered.

## 2020-07-08 ENCOUNTER — Other Ambulatory Visit: Payer: Self-pay | Admitting: Cardiovascular Disease

## 2020-07-08 MED ORDER — AMLODIPINE BESYLATE 5 MG PO TABS: 5.0000 mg | ORAL_TABLET | Freq: Every day | ORAL | 3 refills | Status: DC

## 2020-07-08 NOTE — Telephone Encounter (Signed)
Requested Prescriptions   Signed Prescriptions Disp Refills   amLODipine (NORVASC) 5 MG tablet 30 tablet 3    Sig: Take 1 tablet (5 mg total) by mouth daily.    Authorizing Provider: Rise Mu    Ordering User: Britt Bottom

## 2020-07-08 NOTE — Telephone Encounter (Signed)
*  STAT* If patient is at the pharmacy, call can be transferred to refill team.   1. Which medications need to be refilled? (please list name of each medication and dose if known) amlodipine 5 mg  2. Which pharmacy/location (including street and city if local pharmacy) is [facility] [address]  3. Do they need a 30 day or 90 day supply? [place]

## 2020-07-09 ENCOUNTER — Other Ambulatory Visit
Admission: RE | Admit: 2020-07-09 | Discharge: 2020-07-09 | Disposition: A | Discharge status: Home / Self Care | Payer: 59 | Attending: Physician Assistant | Admitting: Physician Assistant

## 2020-07-09 ENCOUNTER — Telehealth: Payer: Self-pay

## 2020-07-09 DIAGNOSIS — E785 Hyperlipidemia, unspecified: Secondary | ICD-10-CM | POA: Insufficient documentation

## 2020-07-09 DIAGNOSIS — I2510 Atherosclerotic heart disease of native coronary artery without angina pectoris: Secondary | ICD-10-CM | POA: Diagnosis present

## 2020-07-09 LAB — HEPATIC FUNCTION PANEL
ALT: 11 U/L (ref 0–44)
AST: 15 U/L (ref 15–41)
Albumin: 4.3 g/dL (ref 3.5–5.0)
Alkaline Phosphatase: 74 U/L (ref 38–126)
Bilirubin, Direct: 0.1 mg/dL (ref 0.0–0.2)
Indirect Bilirubin: 0.8 mg/dL (ref 0.3–0.9)
Total Bilirubin: 0.9 mg/dL (ref 0.3–1.2)
Total Protein: 8.3 g/dL — ABNORMAL HIGH (ref 6.5–8.1)

## 2020-07-09 LAB — LIPID PANEL
Cholesterol: 151 mg/dL (ref 0–200)
HDL: 32 mg/dL — ABNORMAL LOW (ref >40)
LDL Cholesterol: 97 mg/dL (ref 0–99)
Total CHOL/HDL Ratio: 4.7 RATIO
Triglycerides: 110 mg/dL (ref <150)
VLDL: 22 mg/dL (ref 0–40)

## 2020-07-09 MED ORDER — PRAVASTATIN SODIUM 40 MG PO TABS
40.0000 mg | ORAL_TABLET | Freq: Every evening | ORAL | 3 refills | Status: DC
Start: 2020-07-09 — End: 2021-07-20

## 2020-07-09 MED ORDER — AMLODIPINE BESYLATE 5 MG PO TABS: 5.0000 mg | ORAL_TABLET | Freq: Every day | ORAL | 3 refills | Status: DC

## 2020-07-09 NOTE — Telephone Encounter (Signed)
Medication sent to pharmacy  

## 2020-07-09 NOTE — Addendum Note (Signed)
Addended by: Julian Hy T on: 07/09/2020 02:48 PM   Modules accepted: Orders

## 2020-07-09 NOTE — Telephone Encounter (Signed)
Patient states pharmacy has not received this  Please send again

## 2020-07-09 NOTE — Telephone Encounter (Signed)
Was able to speak with pt regarding his recent lab work, reported from [employer] PA-C: "Liver function normal LDL remains above goal  Recommendations: -After his last check in 03/2020 he reported he had not been taking his medications on a daily basis and indicated he would do so with a plan to recheck lipid this month which demonstrates his LDL remains above goal -He has documented intolerance to high intensity statins -If he has been adherent with pravastatin, his LDL remains above goal of less than 70, I recommend we refer him to the lipid clinic for consideration of PCSK9 inhibitor"  At this time pt is not wanting to be refer to the lipid clinic for consideration of PCSK9 inhibitor, however would like his current pravastatin refilled as he has taken his last dose this morning and has no refills. Medication refill sent to pharmacy.  Pt with no other questions or concerns, will call clinic for sooner follow-up appointment if needed.

## 2020-07-09 NOTE — Telephone Encounter (Signed)
-----   Message from Rise Mu, PA-C sent at 07/09/2020 10:03 AM EST ----- Liver function normal LDL remains above goal  Recommendations: -After his last check in 03/2020 he reported he had not been taking his medications on a daily basis and indicated he would do so with a plan to recheck lipid this month which demonstrates his LDL remains above goal -He has documented intolerance to high intensity statins -If he has been adherent with pravastatin, his LDL remains above goal of less than 70, I recommend we refer him to the lipid clinic for consideration of PCSK9 inhibitor

## 2021-03-25 ENCOUNTER — Other Ambulatory Visit: Payer: Self-pay

## 2021-03-25 ENCOUNTER — Telehealth: Payer: Self-pay | Admitting: Cardiovascular Disease

## 2021-03-25 MED ORDER — AMLODIPINE BESYLATE 5 MG PO TABS: 5.0000 mg | ORAL_TABLET | Freq: Every day | ORAL | 3 refills | Status: DC

## 2021-03-25 NOTE — Telephone Encounter (Signed)
*  STAT* If patient is at the pharmacy, call can be transferred to refill team.   1. Which medications need to be refilled? (please list name of each medication and dose if known) amlodipine 5 mg po q d   2. Which pharmacy/location (including street and city if local pharmacy) is medication to be sent to? [facility] [address] [place]   3. Do they need a 30 day or 90 day supply? [place]

## 2021-03-25 NOTE — Telephone Encounter (Signed)
amLODipine (NORVASC) 5 MG tablet 90 tablet 3 03/25/2021 06/23/2021   Sig - Route: Take 1 tablet (5 mg total) by mouth daily. - Oral   Sent to pharmacy as: amLODipine (NORVASC) 5 MG tablet   E-Prescribing Status: Receipt confirmed by pharmacy (03/25/2021 10:15 AM EDT)     Pharmacy  [facility] [place], [place]

## 2021-05-21 ENCOUNTER — Ambulatory Visit: Payer: 59 | Admitting: Cardiovascular Disease

## 2021-05-21 ENCOUNTER — Encounter: Payer: Self-pay | Admitting: Cardiovascular Disease

## 2021-05-21 ENCOUNTER — Other Ambulatory Visit: Payer: Self-pay

## 2021-05-21 VITALS — BP 118/78 | HR 77 | Ht 72.0 in | Wt 155.0 lb

## 2021-05-21 DIAGNOSIS — I2510 Atherosclerotic heart disease of native coronary artery without angina pectoris: Secondary | ICD-10-CM | POA: Diagnosis not present

## 2021-05-21 DIAGNOSIS — E785 Hyperlipidemia, unspecified: Secondary | ICD-10-CM | POA: Diagnosis not present

## 2021-05-21 DIAGNOSIS — I10 Essential (primary) hypertension: Secondary | ICD-10-CM | POA: Diagnosis not present

## 2021-05-21 DIAGNOSIS — Z720 Tobacco use: Secondary | ICD-10-CM

## 2021-05-21 DIAGNOSIS — I714 Abdominal aortic aneurysm, without rupture, unspecified: Secondary | ICD-10-CM

## 2021-05-21 NOTE — Patient Instructions (Signed)
Medication Instructions:  Your physician recommends that you continue on your current medications as directed. Please refer to the Current Medication list given to you today.  *If you need a refill on your cardiac medications before your next appointment, please call your pharmacy*   Lab Work: Lipid, Cmp, Cbc today  If you have labs (blood work) drawn today and your tests are completely normal, you will receive your results only by: [place] (if you have MyChart) OR A paper copy in the mail If you have any lab test that is abnormal or we need to change your treatment, we will call you to review the results.   Testing/Procedures: Your physician has requested that you have an abdominal aorta duplex. During this test, an ultrasound is used to evaluate the aorta. Allow 30 minutes for this exam. Do not eat after midnight the day before and avoid carbonated beverages    Follow-Up: At [facility], you and your health needs are our priority.  As part of our continuing mission to provide you with exceptional heart care, we have created designated Provider Care Teams.  These Care Teams include your primary Cardiologist (physician) and Advanced Practice Providers (APPs -  Physician Assistants and Nurse Practitioners) who all work together to provide you with the care you need, when you need it.  We recommend signing up for the patient portal called "MyChart".  Sign up information is provided on this After Visit Summary.  MyChart is used to connect with patients for Virtual Visits (Telemedicine).  Patients are able to view lab/test results, encounter notes, upcoming appointments, etc.  Non-urgent messages can be sent to your provider as well.   To learn more about what you can do with MyChart, go to [email or web address].    Your next appointment:   Your physician wants you to follow-up in: 1 year You will receive a reminder letter in the mail two months in advance. If you don't receive a  letter, please call our office to schedule the follow-up appointment.   The format for your next appointment:   In Person  Provider:   You may see Kathlyn Sacramento, MD or one of the following Advanced Practice Providers on your designated Care Team:   Murray Hodgkins, NP Christell Faith, PA-C Marrianne Mood, PA-C Cadence Kathlen Mody, [place]   Other Instructions N/A

## 2021-05-21 NOTE — Progress Notes (Signed)
Cardiology Office Note   Date:  08/30/2016   ID:  Dale Becker, DOB Jun 02, 1941, MRN [id number]  PCP:  Juluis Pitch, MD  Cardiologist:   Kathlyn Sacramento, MD   Chief Complaint  Patient presents with   other    6 month f/u. Meds reviewed verbally with pt.      History of Present Illness: Dale Becker is a 80 y.o. male who presents for  a followup visit regarding coronary artery disease. He Had non-ST elevation myocardial infarction in June 2014. Echocardiogram showed normal LV and RV systolic function without significant valvular abnormalities. He underwent  cardiac catheterization which showed moderate disease in the mid left circumflex and proximal RCA. Pressure wire interrogation was performed on both and was not obstructive. The left circumflex appeared slightly hazy and possibly the site of a plaque rupture .  Unfortunately, he continues to smoke. He did not tolerate atorvastatin, simvastatin or rosuvastatin due to myalgia. He Is currently on pravastatin ABI in June 2016 was normal and duplex showed diffuse nonobstructive disease.  He is known to have abdominal aortic aneurysm.  He has been doing well and denies any chest pain, shortness of breath or worsening dyspnea.  He reports inability to quit smoking.  No claudication.      Hyperlipidemia    Hypertension    MI (myocardial infarction)    Tobacco use     Past Surgical History:  Procedure Laterality Date   BRAIN TUMOR EXCISION     CARDIAC CATHETERIZATION  01/2013   [facility];EF 70 %     Current Outpatient Prescriptions  Medication Sig Dispense Refill   aspirin 81 MG tablet Take 81 mg by mouth daily.     lisinopril (PRINIVIL,ZESTRIL) 10 MG tablet Take 1 tablet (10 mg total) by mouth daily. 30 tablet 5   NITROSTAT 0.4 MG SL tablet Place 0.4 mg under the tongue every 5 (five) minutes as needed.      pravastatin (PRAVACHOL) 20 MG tablet Take 1 tablet (20 mg total) by mouth every evening. 30 tablet 5   No current  facility-administered medications for this visit.     Allergies:   Patient has no known allergies.    Social History:  The patient  reports that he has been smoking Cigarettes.  He has a 50.00 pack-year smoking history. He has never used smokeless tobacco. He reports that he does not drink alcohol or use drugs.   Family History:  The patient's Family history is unknown by patient.    ROS:  Please see the history of present illness.   Otherwise, review of systems are positive for none.   All other systems are reviewed and negative.    PHYSICAL EXAM: VS:  BP 130/80 (BP Location: Left Arm, Patient Position: Sitting, Cuff Size: Normal)   Pulse 65   Ht 6\' 1"  (1.854 m)   Wt 172 lb 12 oz (78.4 kg)   BMI 22.79 kg/m  , BMI Body mass index is 22.79 kg/m. GEN: Well nourished, well developed, in no acute distress  HEENT: normal  Neck: no JVD, carotid bruits, or masses Cardiac: RRR; no murmurs, rubs, or gallops,no edema  Respiratory:  clear to auscultation bilaterally, normal work of breathing GI: soft, nontender, nondistended, + BS MS: no deformity or atrophy  Skin: warm and dry, no rash Neuro:  Strength and sensation are intact Psych: euthymic mood, full affect   EKG:  EKG is ordered today. The ekg ordered today demonstrates normal sinus rhythm with right  bundle branch block   Recent Labs: No results found for requested labs within last 8760 hours.    Lipid Panel    Component Value Date/Time   CHOL 130 07/09/2014 0912   CHOL 162 01/26/2013 0402   TRIG 85 07/09/2014 0912   TRIG 76 01/26/2013 0402   HDL 31 (L) 07/09/2014 0912   HDL 39 (L) 01/26/2013 0402   CHOLHDL 4.2 07/09/2014 0912   VLDL 15 01/26/2013 0402   LDLCALC 82 07/09/2014 0912   LDLCALC 108 (H) 01/26/2013 0402      Wt Readings from Last 3 Encounters:  08/30/16 172 lb 12 oz (78.4 kg)  01/26/16 170 lb 8 oz (77.3 kg)  08/12/15 168 lb 4 oz (76.3 kg)         ASSESSMENT AND PLAN:  1.  Coronary artery  disease involving native coronary arteries without angina:He is doing well from a cardiac standpoint with no anginal symptoms.  Continue medical therapy   2. Hyperlipidemia: He is intolerant to statins with the exception of pravastatin.  I requested lipid and liver profile today.  If LDL remains above 100, will recommend adding Zetia.    3. Essential hypertension: Blood pressures controlled with amlodipine.  The patient needs routine labs done including basic metabolic profile and CBC which were requested.  4. Tobacco use: I again discussed with him the importance of smoking cessation.   5.  Abdominal aortic aneurysm: Most recent ultrasound last year showed progression in size to 3.7 cm.  He is due for a repeat follow-up study which was requested today.  I discussed with him the importance of controlling his risk factors.  Disposition:   FU with me in 12 months.   Signed,  Kathlyn Sacramento, MD  08/30/2016 2:11 PM    [facility]

## 2021-05-22 LAB — COMPREHENSIVE METABOLIC PANEL
ALT: 15 IU/L (ref 0–44)
AST: 15 IU/L (ref 0–40)
Albumin/Globulin Ratio: 1.4 (ref 1.2–2.2)
Albumin: 4.4 g/dL (ref 3.7–4.7)
Alkaline Phosphatase: 84 IU/L (ref 44–121)
BUN/Creatinine Ratio: 8 — ABNORMAL LOW (ref 10–24)
BUN: 9 mg/dL (ref 8–27)
Bilirubin Total: 0.4 mg/dL (ref 0.0–1.2)
CO2: 24 mmol/L (ref 20–29)
Calcium: 9.9 mg/dL (ref 8.6–10.2)
Chloride: 102 mmol/L (ref 96–106)
Creatinine, Ser: 1.17 mg/dL (ref 0.76–1.27)
Globulin, Total: 3.2 g/dL (ref 1.5–4.5)
Glucose: 87 mg/dL (ref 70–99)
Potassium: 4.5 mmol/L (ref 3.5–5.2)
Sodium: 141 mmol/L (ref 134–144)
Total Protein: 7.6 g/dL (ref 6.0–8.5)
eGFR: 64 mL/min/{1.73_m2} (ref >59)

## 2021-05-22 LAB — CBC WITH DIFFERENTIAL/PLATELET

## 2021-05-22 LAB — LIPID PANEL
Chol/HDL Ratio: 4.8 ratio (ref 0.0–5.0)
Cholesterol, Total: 158 mg/dL (ref 100–199)
HDL: 33 mg/dL — ABNORMAL LOW (ref >39)
LDL Chol Calc (NIH): 107 mg/dL — ABNORMAL HIGH (ref 0–99)
Triglycerides: 98 mg/dL (ref 0–149)
VLDL Cholesterol Cal: 18 mg/dL (ref 5–40)

## 2021-05-25 ENCOUNTER — Telehealth: Payer: Self-pay

## 2021-05-25 DIAGNOSIS — E785 Hyperlipidemia, unspecified: Secondary | ICD-10-CM

## 2021-05-25 DIAGNOSIS — I2510 Atherosclerotic heart disease of native coronary artery without angina pectoris: Secondary | ICD-10-CM

## 2021-05-25 NOTE — Telephone Encounter (Signed)
-----   Message from Wellington Hampshire, MD sent at 05/22/2021  2:34 PM EDT ----- Inform patient that labs were normal.  His LDL cholesterol continues to be mildly elevated.  Recommend adding Zetia 10 mg daily and continue pravastatin. CBC was not done.  We do not need to do right now but recommend checking lipid and liver profile in 2 months and we can do CBC at that time as well.

## 2021-05-25 NOTE — Telephone Encounter (Signed)
Called to give the patient lab results and Dr. Tyrell Antonio recommendation. Unable to lmtcb. Patients phone rings out with no voicemail option.

## 2021-05-27 MED ORDER — EZETIMIBE 10 MG PO TABS: 10.0000 mg | ORAL_TABLET | Freq: Every day | ORAL | 2 refills | Status: DC

## 2021-05-27 NOTE — Telephone Encounter (Signed)
-----   Message from Wellington Hampshire, MD sent at 05/22/2021  2:34 PM EDT ----- Inform patient that labs were normal.  His LDL cholesterol continues to be mildly elevated.  Recommend adding Zetia 10 mg daily and continue pravastatin. CBC was not done.  We do not need to do right now but recommend checking lipid and liver profile in 2 months and we can do CBC at that time as well.

## 2021-05-27 NOTE — Telephone Encounter (Signed)
Patient made aware of lab results and Dr. Tyrell Antonio recommendation. Patient is agreeable with starting Zetia 10 mg daily. RX sent to the patients pharmacy. Orders placed for 2 mo fasting lipid, lft, cbc. Advised the patient that our lab 2 months schedule is currently available and we will contact him at a letter date to schedule the lab appt.  Patient verbalized understanding and voiced appreciation for the call.

## 2021-06-24 ENCOUNTER — Other Ambulatory Visit: Payer: Self-pay

## 2021-06-24 ENCOUNTER — Ambulatory Visit (INDEPENDENT_AMBULATORY_CARE_PROVIDER_SITE_OTHER): Payer: 59

## 2021-06-24 DIAGNOSIS — I714 Abdominal aortic aneurysm, without rupture, unspecified: Secondary | ICD-10-CM | POA: Diagnosis not present

## 2021-06-26 ENCOUNTER — Telehealth: Payer: Self-pay

## 2021-06-26 DIAGNOSIS — I714 Abdominal aortic aneurysm, without rupture, unspecified: Secondary | ICD-10-CM

## 2021-06-26 NOTE — Telephone Encounter (Signed)
-----   Message from Wellington Hampshire, MD sent at 06/26/2021  7:58 AM EST ----- Inform patient that ultrasound showed increase in the size of abdominal aortic aneurysm compared to last year.  Given that it has increased by more than 0.5 cm in 1 year, recommend referral to vascular surgery for evaluation.  Please refer to [facility]

## 2021-06-26 NOTE — Telephone Encounter (Signed)
Patient made aware of AAA duplex results and Dr. Tyrell Antonio recommendation. Ref to [facility] placed. Advised the patient that their office will call him to schedule the appt. Patient verbalized understanding.

## 2021-06-26 NOTE — Telephone Encounter (Signed)
Called to give the patient AAA duplex results and Dr. Tyrell Antonio recommendation. Lmtcb.

## 2021-07-14 ENCOUNTER — Telehealth: Payer: Self-pay | Admitting: Cardiovascular Disease

## 2021-07-14 NOTE — Telephone Encounter (Signed)
LVM to schedule Lab appt.

## 2021-07-20 ENCOUNTER — Telehealth: Payer: Self-pay

## 2021-07-20 ENCOUNTER — Other Ambulatory Visit: Payer: Self-pay | Admitting: Physician Assistant

## 2021-07-20 NOTE — Telephone Encounter (Signed)
Rx for pravastatin sent to pharmacy as requested earlier today.

## 2021-07-20 NOTE — Telephone Encounter (Signed)
*  STAT* If patient is at the pharmacy, call can be transferred to refill team.   1. Which medications need to be refilled? (please list name of each medication and dose if known) Pravastatin 2. Which pharmacy/location (including street and city if local pharmacy) is medication to be sent to? [place]  3. Do they need a 30 day or 90 day supply? [place]

## 2021-07-27 ENCOUNTER — Other Ambulatory Visit: Payer: Self-pay

## 2021-07-27 ENCOUNTER — Other Ambulatory Visit (INDEPENDENT_AMBULATORY_CARE_PROVIDER_SITE_OTHER): Payer: 59

## 2021-07-27 DIAGNOSIS — I2510 Atherosclerotic heart disease of native coronary artery without angina pectoris: Secondary | ICD-10-CM

## 2021-07-27 DIAGNOSIS — E785 Hyperlipidemia, unspecified: Secondary | ICD-10-CM

## 2021-07-28 LAB — HEPATIC FUNCTION PANEL
ALT: 14 IU/L (ref 0–44)
AST: 15 IU/L (ref 0–40)
Albumin: 4.1 g/dL (ref 3.7–4.7)
Alkaline Phosphatase: 86 IU/L (ref 44–121)
Bilirubin Total: 0.5 mg/dL (ref 0.0–1.2)
Bilirubin, Direct: <0.1 mg/dL (ref 0.00–0.40)
Total Protein: 6.9 g/dL (ref 6.0–8.5)

## 2021-07-28 LAB — LIPID PANEL
Chol/HDL Ratio: 3.8 ratio (ref 0.0–5.0)
Cholesterol, Total: 121 mg/dL (ref 100–199)
HDL: 32 mg/dL — ABNORMAL LOW (ref >39)
LDL Chol Calc (NIH): 74 mg/dL (ref 0–99)
Triglycerides: 72 mg/dL (ref 0–149)
VLDL Cholesterol Cal: 15 mg/dL (ref 5–40)

## 2021-07-28 LAB — CBC WITH DIFFERENTIAL/PLATELET
Basophils Absolute: 0 10*3/uL (ref 0.0–0.2)
Basos: 1 %
EOS (ABSOLUTE): 0.1 10*3/uL (ref 0.0–0.4)
Eos: 2 %
Hematocrit: 40.7 % (ref 37.5–51.0)
Hemoglobin: 14.1 g/dL (ref 13.0–17.7)
Immature Grans (Abs): 0 10*3/uL (ref 0.0–0.1)
Immature Granulocytes: 0 %
Lymphocytes Absolute: 1.2 10*3/uL (ref 0.7–3.1)
Lymphs: 40 %
MCH: 30.5 pg (ref 26.6–33.0)
MCHC: 34.6 g/dL (ref 31.5–35.7)
MCV: 88 fL (ref 79–97)
Monocytes Absolute: 0.3 10*3/uL (ref 0.1–0.9)
Monocytes: 10 %
Neutrophils Absolute: 1.4 10*3/uL (ref 1.4–7.0)
Neutrophils: 47 %
Platelets: 190 10*3/uL (ref 150–450)
RBC: 4.62 x10E6/uL (ref 4.14–5.80)
RDW: 13.7 % (ref 11.6–15.4)
WBC: 3 10*3/uL — ABNORMAL LOW (ref 3.4–10.8)

## 2021-08-12 ENCOUNTER — Encounter (INDEPENDENT_AMBULATORY_CARE_PROVIDER_SITE_OTHER): Payer: Self-pay | Admitting: Nurse Practitioner

## 2021-08-12 ENCOUNTER — Other Ambulatory Visit: Payer: Self-pay

## 2021-08-12 ENCOUNTER — Ambulatory Visit (INDEPENDENT_AMBULATORY_CARE_PROVIDER_SITE_OTHER): Payer: 59 | Admitting: Nurse Practitioner

## 2021-08-12 VITALS — BP 143/84 | HR 83 | Ht 72.0 in | Wt 156.0 lb

## 2021-08-12 DIAGNOSIS — I10 Essential (primary) hypertension: Secondary | ICD-10-CM | POA: Diagnosis not present

## 2021-08-12 DIAGNOSIS — I7143 Infrarenal abdominal aortic aneurysm, without rupture: Secondary | ICD-10-CM

## 2021-08-12 DIAGNOSIS — E785 Hyperlipidemia, unspecified: Secondary | ICD-10-CM

## 2021-08-12 DIAGNOSIS — Z720 Tobacco use: Secondary | ICD-10-CM | POA: Diagnosis not present

## 2021-08-14 ENCOUNTER — Other Ambulatory Visit: Payer: Self-pay | Admitting: Cardiovascular Disease

## 2021-08-14 DIAGNOSIS — I714 Abdominal aortic aneurysm, without rupture, unspecified: Secondary | ICD-10-CM

## 2021-08-23 ENCOUNTER — Encounter (INDEPENDENT_AMBULATORY_CARE_PROVIDER_SITE_OTHER): Payer: Self-pay | Admitting: Nurse Practitioner

## 2021-08-23 NOTE — Progress Notes (Signed)
Subjective:    Patient ID: Dale Becker, male    DOB: May 15, 1941, 81 y.o.   MRN: [id number] Chief Complaint  Patient presents with   New Patient (Initial Visit)    NP Abd Aneurysm  unruptured    The patient presents to the office for evaluation of an abdominal aortic aneurysm. The aneurysm has been followed by his cardiologist.  His most recent scan in November 2022 shows growth from 3.7 cm on 9 2021 to 4.4 cm.  Patient denies abdominal pain or unusual back pain, no other abdominal complaints.  No history of an acute onset of painful blue discoloration of the toes.     No family history of AAA.   Patient denies amaurosis fugax or TIA symptoms. There is no history of claudication or rest pain symptoms of the lower extremities.  The patient denies angina or shortness of breath.     Review of Systems  Gastrointestinal:  Negative for abdominal pain.  All other systems reviewed and are negative.     Objective:   Physical Exam Vitals reviewed.  HENT:     Head: Normocephalic.  Cardiovascular:     Rate and Rhythm: Normal rate.     Pulses: Decreased pulses.  Pulmonary:     Effort: Pulmonary effort is normal.  Skin:    General: Skin is warm and dry.  Neurological:     Mental Status: He is alert and oriented to person, place, and time.  Psychiatric:        Mood and Affect: Mood normal.        Behavior: Behavior normal.        Thought Content: Thought content normal.        Judgment: Judgment normal.    BP (!) 143/84    Pulse 83    Ht 6' (1.829 m)    Wt 156 lb (70.8 kg)    BMI 21.16 kg/m   Past Medical History:  Diagnosis Date   AAA (abdominal aortic aneurysm)    a. 09/2016 Ao Duplex: 3.2 x 3.4 cm juxtarenal AAA - 3.1 cm in length; b. 09/2017 Ao Duplex: largest Ao measurement 3.4cm. Overall stable.   Brain tumor [facility])    Coronary artery disease 01/2013   Presented in June of 2014 with non-ST elevation myocardial infarction(peak troponin of 22). Cardiac catheterization  showed moderate nonobstructive disease in the left circumflex and proximal RCA with an FFR ratio of 0.88 and 0.90 respectively. Ejection fraction was 70% and hyperdynamic. MI was likely due to plaque rupture without obstructive disease.    History of echocardiogram    a. 02/2013 Echo: EF 55-60%, mild TR.   Hyperlipidemia    Hypertension    MI (myocardial infarction) ([place]) 01/2013   Tobacco use     Social History   Socioeconomic History   Marital status: Married    Spouse name: Not on file   Number of children: Not on file   Years of education: Not on file   Highest education level: Not on file  Occupational History   Not on file  Tobacco Use   Smoking status: Every Day    Packs/day: 1.00    Years: 50.00    Pack years: 50.00    Types: Cigarettes   Smokeless tobacco: Never  Vaping Use   Vaping Use: Never used  Substance and Sexual Activity   Alcohol use: No   Drug use: No   Sexual activity: Not on file  Other Topics Concern   Not  on file  Social History Narrative   Not on file   Social Determinants of Health   Financial Resource Strain: Not on file  Food Insecurity: Not on file  Transportation Needs: Not on file  Physical Activity: Not on file  Stress: Not on file  Social Connections: Not on file  Intimate Partner Violence: Not on file    Past Surgical History:  Procedure Laterality Date   BRAIN TUMOR EXCISION     CARDIAC CATHETERIZATION  01/2013   [facility];EF 70 %    Family History  Family history unknown: Yes    Allergies  Allergen Reactions   Atorvastatin     Myalgias    Lisinopril Other (See Comments)    Angioedema.   Rosuvastatin     Myalgias    Simvastatin     Myalgias     CBC Latest Ref Rng & Units 07/27/2021 05/21/2021 03/16/2019  WBC 3.4 - 10.8 x10E3/uL 3.0(L) CANCELED 3.1(L)  Hemoglobin 13.0 - 17.7 g/dL 14.1 CANCELED 14.6  Hematocrit 37.5 - 51.0 % 40.7 CANCELED 42.0  Platelets 150 - 450 x10E3/uL 190 CANCELED 194      CMP      Component Value Date/Time   NA 141 05/21/2021 0946   NA 131 (L) 07/20/2013 1301   K 4.5 05/21/2021 0946   K 3.5 07/20/2013 1301   CL 102 05/21/2021 0946   CL 97 (L) 07/20/2013 1301   CO2 24 05/21/2021 0946   CO2 32 07/20/2013 1301   GLUCOSE 87 05/21/2021 0946   GLUCOSE 93 10/12/2017 1014   GLUCOSE 124 (H) 07/20/2013 1301   BUN 9 05/21/2021 0946   BUN 12 07/20/2013 1301   CREATININE 1.17 05/21/2021 0946   CREATININE 1.22 07/20/2013 1301   CALCIUM 9.9 05/21/2021 0946   CALCIUM 8.8 07/20/2013 1301   PROT 6.9 07/27/2021 0819   PROT 8.3 (H) 07/20/2013 1301   ALBUMIN 4.1 07/27/2021 0819   ALBUMIN 3.7 07/20/2013 1301   AST 15 07/27/2021 0819   AST 59 (H) 07/20/2013 1301   ALT 14 07/27/2021 0819   ALT 29 07/20/2013 1301   ALKPHOS 86 07/27/2021 0819   ALKPHOS 63 07/20/2013 1301   BILITOT 0.5 07/27/2021 0819   BILITOT 0.6 07/20/2013 1301   GFRNONAA 60 03/18/2020 0957   GFRNONAA 60 (L) 07/20/2013 1301   GFRAA 69 03/18/2020 0957   GFRAA >60 07/20/2013 1301     No results found.     Assessment & Plan:   1. 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  We will have the patient return in 3 months to reevaluate a resolution of there is continued growth.  If there is noted continued growth we will go consider undergoing a CT angiogram to evaluate further growth.  If it remains the same we may continue with 80-month follow-ups.  2. Hyperlipidemia, unspecified hyperlipidemia type Continue statin as ordered and reviewed, no changes at this time   3. Primary hypertension Continue antihypertensive medications as already ordered, these medications have been reviewed and there are no changes at this time.   4. Tobacco use Smoking cessation was discussed, 3-10 minutes spent on this topic specifically    Current Outpatient Medications on File Prior to Visit  Medication Sig Dispense Refill    aspirin 81 MG tablet Take 81 mg by mouth daily.     ezetimibe (ZETIA) 10 MG tablet Take 1 tablet (10 mg  total) by mouth daily. 90 tablet 2   NITROSTAT 0.4 MG SL tablet Place 0.4 mg under the tongue every 5 (five) minutes as needed.      pravastatin (PRAVACHOL) 40 MG tablet TAKE 1 TABLET BY MOUTH ONCE DAILY IN THE EVENING 30 tablet 9   amLODipine (NORVASC) 5 MG tablet Take 1 tablet (5 mg total) by mouth daily. 90 tablet 3   No current facility-administered medications on file prior to visit.    There are no Patient Instructions on file for this visit. No follow-ups on file.   Kris Hartmann, NP

## 2021-09-28 ENCOUNTER — Telehealth: Payer: Self-pay | Admitting: Cardiovascular Disease

## 2021-09-28 NOTE — Telephone Encounter (Signed)
Pt c/o medication issue:  1. Name of Medication: patient not sure of name - states cholesterol medication  2. How are you currently taking this medication (dosage and times per day)? 1 tablet daily  3. Are you having a reaction (difficulty breathing--STAT)? Hiccups   4. What is your medication issue? Patient states that he has been having issues with hiccups this weekend.  Would like to know if this could be caused by cholesterol medication.  Please call to discuss.

## 2021-09-28 NOTE — Telephone Encounter (Signed)
Returned the patients call. Lmtcb. 

## 2021-09-28 NOTE — Telephone Encounter (Signed)
Patient returning call.

## 2021-09-29 NOTE — Telephone Encounter (Signed)
Spoke wit the patient.  Advised the patient that his medication should not be causing his hiccup episodes. He denies cardiac symptoms. No chest pain/pressure, sob, swelling, palpitations, n/v.  Patient sts that he does heartburn and the hiccups after meals. He was seen in [facility] urgent care on 09/22/21 and prescribed prilosec. He has been taking his prilosec with food. Instructed the patient to take it first thing in the morning prior to breakfast. Adv him to f/u with his pcp if no improvement in his symptoms. He is to contact our office if cardiac symptoms develop. Patient verbalized understanding and voiced appreciation for the call.

## 2021-11-11 ENCOUNTER — Other Ambulatory Visit (INDEPENDENT_AMBULATORY_CARE_PROVIDER_SITE_OTHER): Payer: Self-pay | Admitting: Nurse Practitioner

## 2021-11-11 DIAGNOSIS — I714 Abdominal aortic aneurysm, without rupture, unspecified: Secondary | ICD-10-CM

## 2021-11-12 ENCOUNTER — Encounter (INDEPENDENT_AMBULATORY_CARE_PROVIDER_SITE_OTHER): Payer: Self-pay | Admitting: Nurse Practitioner

## 2021-11-12 ENCOUNTER — Ambulatory Visit (INDEPENDENT_AMBULATORY_CARE_PROVIDER_SITE_OTHER): Payer: 59

## 2021-11-12 ENCOUNTER — Ambulatory Visit (INDEPENDENT_AMBULATORY_CARE_PROVIDER_SITE_OTHER): Payer: 59 | Admitting: Nurse Practitioner

## 2021-11-12 VITALS — BP 148/88 | HR 59 | Resp 16 | Ht 72.0 in | Wt 155.8 lb

## 2021-11-12 DIAGNOSIS — I714 Abdominal aortic aneurysm, without rupture, unspecified: Secondary | ICD-10-CM

## 2021-11-12 DIAGNOSIS — Z720 Tobacco use: Secondary | ICD-10-CM

## 2021-11-12 DIAGNOSIS — E785 Hyperlipidemia, unspecified: Secondary | ICD-10-CM

## 2021-11-12 DIAGNOSIS — I7143 Infrarenal abdominal aortic aneurysm, without rupture: Secondary | ICD-10-CM

## 2021-11-12 DIAGNOSIS — I10 Essential (primary) hypertension: Secondary | ICD-10-CM | POA: Diagnosis not present

## 2021-11-16 ENCOUNTER — Encounter (INDEPENDENT_AMBULATORY_CARE_PROVIDER_SITE_OTHER): Payer: Self-pay | Admitting: Nurse Practitioner

## 2021-11-16 NOTE — Progress Notes (Signed)
? ?Subjective:  ? ? Patient ID: Dale Becker, male    DOB: 1941/02/09, 81 y.o.   MRN: [id number] ?Chief Complaint  ?Patient presents with  ? Follow-up  ?  ultrasound  ? ? ?The patient presents to the office for evaluation of an abdominal aortic aneurysm. The aneurysm has been followed by his cardiologist.  His previous scan in November 2022 shows growth from 3.7 cm on 9 2021 to 4.4 cm.  Patient denies abdominal pain or unusual back pain, no other abdominal complaints.  No history of an acute onset of painful blue discoloration of the toes.    ?  ?No family history of AAA.  ?  ?Patient denies amaurosis fugax or TIA symptoms. There is no history of claudication or rest pain symptoms of the lower extremities.  The patient denies angina or shortness of breath. ? ?Today his noninvasive studies show enlargement to 5.32 cm x 4.90 cm. ? ? ?Review of Systems  ?Gastrointestinal:  Negative for abdominal pain.  ?All other systems reviewed and are negative. ? ?   ?Objective:  ? Physical Exam ?Vitals reviewed.  ?HENT:  ?   Head: Normocephalic.  ?Cardiovascular:  ?   Rate and Rhythm: Normal rate.  ?   Pulses: Decreased pulses.  ?Pulmonary:  ?   Effort: Pulmonary effort is normal.  ?Skin: ?   General: Skin is warm and dry.  ?Neurological:  ?   Mental Status: He is alert and oriented to person, place, and time.  ?Psychiatric:     ?   Mood and Affect: Mood normal.     ?   Behavior: Behavior normal.     ?   Thought Content: Thought content normal.     ?   Judgment: Judgment normal.  ? ? ?BP (!) 148/88 (BP Location: Left Arm)   Pulse (!) 59   Resp 16   Ht 6' (1.829 m)   Wt 155 lb 12.8 oz (70.7 kg)   BMI 21.13 kg/m?  ? ?Past Medical History:  ?Diagnosis Date  ? AAA (abdominal aortic aneurysm) ([place])   ? a. 09/2016 Ao Duplex: 3.2 x 3.4 cm juxtarenal AAA - 3.1 cm in length; b. 09/2017 Ao Duplex: largest Ao measurement 3.4cm. Overall stable.  ? Brain tumor ([place])   ? Coronary artery disease 01/2013  ? Presented in June of 2014 with  non-ST elevation myocardial infarction(peak troponin of 22). Cardiac catheterization showed moderate nonobstructive disease in the left circumflex and proximal RCA with an FFR ratio of 0.88 and 0.90 respectively. Ejection fraction was 70% and hyperdynamic. MI was likely due to plaque rupture without obstructive disease.   ? History of echocardiogram   ? a. 02/2013 Echo: EF 55-60%, mild TR.  ? Hyperlipidemia   ? Hypertension   ? MI (myocardial infarction) ([place]) 01/2013  ? Tobacco use   ? ? ?Social History  ? ?Socioeconomic History  ? Marital status: Married  ?  Spouse name: Not on file  ? Number of children: Not on file  ? Years of education: Not on file  ? Highest education level: Not on file  ?Occupational History  ? Not on file  ?Tobacco Use  ? Smoking status: Every Day  ?  Packs/day: 1.00  ?  Years: 50.00  ?  Pack years: 50.00  ?  Types: Cigarettes  ? Smokeless tobacco: Never  ?Vaping Use  ? Vaping Use: Never used  ?Substance and Sexual Activity  ? Alcohol use: No  ? Drug use: No  ?  Sexual activity: Not on file  ?Other Topics Concern  ? Not on file  ?Social History Narrative  ? Not on file  ? ?Social Determinants of Health  ? ?Financial Resource Strain: Not on file  ?Food Insecurity: Not on file  ?Transportation Needs: Not on file  ?Physical Activity: Not on file  ?Stress: Not on file  ?Social Connections: Not on file  ?Intimate Partner Violence: Not on file  ? ? ?Past Surgical History:  ?Procedure Laterality Date  ? BRAIN TUMOR EXCISION    ? CARDIAC CATHETERIZATION  01/2013  ? [facility];EF 70 %  ? ? ?Family History  ?Family history unknown: Yes  ? ? ?Allergies  ?Allergen Reactions  ? Atorvastatin   ?  Myalgias ?  ? Lisinopril Other (See Comments)  ?  Angioedema.  ? Rosuvastatin   ?  Myalgias ?  ? Simvastatin   ?  Myalgias ?  ? ? ? ?  Latest Ref Rng & Units 07/27/2021  ?  8:19 AM 05/21/2021  ?  9:46 AM 03/16/2019  ?  8:55 AM  ?CBC  ?WBC 3.4 - 10.8 x10E3/uL 3.0   CANCELED   3.1    ?Hemoglobin 13.0 - 17.7 g/dL 14.1    CANCELED   14.6    ?Hematocrit 37.5 - 51.0 % 40.7   CANCELED   42.0    ?Platelets 150 - 450 x10E3/uL 190   CANCELED   194    ? ? ? ? ?CMP  ?   ?Component Value Date/Time  ? NA 141 05/21/2021 0946  ? NA 131 (L) 07/20/2013 1301  ? K 4.5 05/21/2021 0946  ? K 3.5 07/20/2013 1301  ? CL 102 05/21/2021 0946  ? CL 97 (L) 07/20/2013 1301  ? CO2 24 05/21/2021 0946  ? CO2 32 07/20/2013 1301  ? GLUCOSE 87 05/21/2021 0946  ? GLUCOSE 93 10/12/2017 1014  ? GLUCOSE 124 (H) 07/20/2013 1301  ? BUN 9 05/21/2021 0946  ? BUN 12 07/20/2013 1301  ? CREATININE 1.17 05/21/2021 0946  ? CREATININE 1.22 07/20/2013 1301  ? CALCIUM 9.9 05/21/2021 0946  ? CALCIUM 8.8 07/20/2013 1301  ? PROT 6.9 07/27/2021 0819  ? PROT 8.3 (H) 07/20/2013 1301  ? ALBUMIN 4.1 07/27/2021 0819  ? ALBUMIN 3.7 07/20/2013 1301  ? AST 15 07/27/2021 0819  ? AST 59 (H) 07/20/2013 1301  ? ALT 14 07/27/2021 0819  ? ALT 29 07/20/2013 1301  ? ALKPHOS 86 07/27/2021 0819  ? ALKPHOS 63 07/20/2013 1301  ? BILITOT 0.5 07/27/2021 0819  ? BILITOT 0.6 07/20/2013 1301  ? GFRNONAA 60 03/18/2020 0957  ? GFRNONAA 60 (L) 07/20/2013 1301  ? GFRAA 69 03/18/2020 0957  ? GFRAA >60 07/20/2013 1301  ? ? ? ?No results found. ? ?   ?Assessment & Plan:  ? ?1. 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 ? ?The patient is otherwise in reasonable health.  ? ?Therefore, the patient should undergo endovascular repair of the AAA to prevent future leathal rupture.  ? ?Patient will require CT angiography of the abdomen and pelvis in order to appropriately plan repair of the AAA. ? ?The risks and benefits as well as the alternative therapies was discussed in detail with the patient. All questions were answered. The patient agrees to move forward the AAA repair and therefore with CT scan. ? ?  The patient will follow up  with me in the office after the CT scan to review the study.  ?- CT Angio Abd/Pel w/ and/or w/o; Future ? ?2. Hyperlipidemia, unspecified hyperlipidemia type ?Continue statin as ordered and reviewed, no changes at this time  ? ?3. Primary hypertension ?Continue antihypertensive medications as already ordered, these medications have been reviewed and there are no changes at this time.  ? ?4. Tobacco use ?Smoking cessation was discussed, 3-10 minutes spent on this topic specifically  ? ? ?Current Outpatient Medications on File Prior to Visit  ?Medication Sig Dispense Refill  ? amLODipine (NORVASC) 5 MG tablet Take by mouth.    ? aspirin 81 MG tablet Take 81 mg by mouth daily.    ? ezetimibe (ZETIA) 10 MG tablet Take 10 mg by mouth daily.    ? NITROSTAT 0.4 MG SL tablet Place 0.4 mg under the tongue every 5 (five) minutes as needed.     ? pravastatin (PRAVACHOL) 40 MG tablet TAKE 1 TABLET BY MOUTH ONCE DAILY IN THE EVENING 30 tablet 9  ? ?No current facility-administered medications on file prior to visit.  ? ? ?There are no Patient Instructions on file for this visit. ?No follow-ups on file. ? ? ?Kris Hartmann, NP ? ? ?

## 2021-11-17 ENCOUNTER — Telehealth (INDEPENDENT_AMBULATORY_CARE_PROVIDER_SITE_OTHER): Payer: Self-pay | Admitting: Nurse Practitioner

## 2021-11-17 NOTE — Telephone Encounter (Signed)
Spoke with pt to let him know that he can call Radiology scheduling and have his CT scheduled. There is no auth required for his [place] because pt has Medicare. Gave pt # [id number] for scheduling. Pt acknowledged and nothing further is needed at this time.  ?

## 2021-12-07 ENCOUNTER — Ambulatory Visit
Admission: RE | Admit: 2021-12-07 | Discharge: 2021-12-07 | Disposition: A | Discharge status: Home / Self Care | Payer: 59 | Source: Ambulatory Visit | Attending: Nurse Practitioner | Admitting: Nurse Practitioner

## 2021-12-07 DIAGNOSIS — I7143 Infrarenal abdominal aortic aneurysm, without rupture: Secondary | ICD-10-CM | POA: Insufficient documentation

## 2021-12-07 LAB — POCT I-STAT CREATININE: Creatinine, Ser: 1.2 mg/dL (ref 0.61–1.24)

## 2021-12-07 MED ORDER — IOHEXOL 350 MG/ML SOLN
75.0000 mL | Freq: Once | INTRAVENOUS | Status: AC | PRN
  Administered 2021-12-07: 75 mL via INTRAVENOUS

## 2021-12-10 ENCOUNTER — Encounter (INDEPENDENT_AMBULATORY_CARE_PROVIDER_SITE_OTHER): Payer: Self-pay | Admitting: Vascular Surgery

## 2021-12-10 ENCOUNTER — Ambulatory Visit (INDEPENDENT_AMBULATORY_CARE_PROVIDER_SITE_OTHER): Payer: 59 | Admitting: Vascular Surgery

## 2021-12-10 VITALS — BP 139/78 | HR 69 | Resp 16 | Ht 72.0 in | Wt 156.0 lb

## 2021-12-10 DIAGNOSIS — I2510 Atherosclerotic heart disease of native coronary artery without angina pectoris: Secondary | ICD-10-CM

## 2021-12-10 DIAGNOSIS — I7143 Infrarenal abdominal aortic aneurysm, without rupture: Secondary | ICD-10-CM

## 2021-12-10 DIAGNOSIS — E785 Hyperlipidemia, unspecified: Secondary | ICD-10-CM | POA: Diagnosis not present

## 2021-12-10 DIAGNOSIS — I10 Essential (primary) hypertension: Secondary | ICD-10-CM | POA: Diagnosis not present

## 2021-12-10 DIAGNOSIS — I714 Abdominal aortic aneurysm, without rupture, unspecified: Secondary | ICD-10-CM | POA: Insufficient documentation

## 2021-12-10 NOTE — Progress Notes (Signed)
? ? ? ? ?MRN : [id number] ? ?Jagar Lua is a 81 y.o. ([birth year]) male who presents with chief complaint of check circulation. ? ?History of Present Illness: The patient is seen for follow up evaluation of AAA status post CTA. There were no problems or complications related to the CT scan. The patient denies interval development of abdominal or back pain. No new lower extremity pain or discoloration of the toes.  ? ?The patient denies recent episodes of angina or shortness of breath. The patient denies interval anaurosis fugax. There is no recent history of TIA symptoms or focal motor deficits. The patient denies PAD or claudication symptoms.  ? ?CT angiography of the abdomen and pelvis shows an infrarenal AAA 5.8 cm.  ? ?Current Meds  ?Medication Sig  ? amLODipine (NORVASC) 5 MG tablet Take by mouth.  ? aspirin 81 MG tablet Take 81 mg by mouth daily.  ? ezetimibe (ZETIA) 10 MG tablet Take 10 mg by mouth daily.  ? NITROSTAT 0.4 MG SL tablet Place 0.4 mg under the tongue every 5 (five) minutes as needed.   ? pravastatin (PRAVACHOL) 40 MG tablet TAKE 1 TABLET BY MOUTH ONCE DAILY IN THE EVENING  ? ? ?Past Medical History:  ?Diagnosis Date  ? AAA (abdominal aortic aneurysm) ([place])   ? a. 09/2016 Ao Duplex: 3.2 x 3.4 cm juxtarenal AAA - 3.1 cm in length; b. 09/2017 Ao Duplex: largest Ao measurement 3.4cm. Overall stable.  ? Brain tumor ([place])   ? Coronary artery disease 01/2013  ? Presented in June of 2014 with non-ST elevation myocardial infarction(peak troponin of 22). Cardiac catheterization showed moderate nonobstructive disease in the left circumflex and proximal RCA with an FFR ratio of 0.88 and 0.90 respectively. Ejection fraction was 70% and hyperdynamic. MI was likely due to plaque rupture without obstructive disease.   ? History of echocardiogram   ? a. 02/2013 Echo: EF 55-60%, mild TR.  ? Hyperlipidemia   ? Hypertension   ? MI (myocardial infarction) ([place]) 01/2013  ? Tobacco use   ? ? ?Past Surgical History:   ?Procedure Laterality Date  ? BRAIN TUMOR EXCISION    ? CARDIAC CATHETERIZATION  01/2013  ? [facility];EF 70 %  ? ? ?Social History ?Social History  ? ?Tobacco Use  ? Smoking status: Every Day  ?  Packs/day: 1.00  ?  Years: 50.00  ?  Pack years: 50.00  ?  Types: Cigarettes  ? Smokeless tobacco: Never  ?Vaping Use  ? Vaping Use: Never used  ?Substance Use Topics  ? Alcohol use: No  ? Drug use: No  ? ? ?Family History ?Family History  ?Family history unknown: Yes  ? ? ?Allergies  ?Allergen Reactions  ? Atorvastatin   ?  Myalgias ?  ? Lisinopril Other (See Comments)  ?  Angioedema.  ? Rosuvastatin   ?  Myalgias ?  ? Simvastatin   ?  Myalgias ?  ? ? ? ?REVIEW OF SYSTEMS (Negative unless checked) ? ?Constitutional: '[]'$ Weight loss  '[]'$ Fever  '[]'$ Chills ?Cardiac: '[]'$ Chest pain   '[]'$ Chest pressure   '[]'$ Palpitations   '[]'$ Shortness of breath when laying flat   '[]'$ Shortness of breath with exertion. ?Vascular:  '[x]'$ Pain in legs with walking   '[]'$ Pain in legs at rest  '[]'$ History of DVT   '[]'$ Phlebitis   '[]'$ Swelling in legs   '[]'$ Varicose veins   '[]'$ Non-healing ulcers ?Pulmonary:   '[]'$ Uses home oxygen   '[]'$ Productive cough   '[]'$ Hemoptysis   '[]'$ Wheeze  '[]'$ COPD   '[]'$ Asthma ?Neurologic:  '[]'$   Dizziness   '[]'$ Seizures   '[]'$ History of stroke   '[]'$ History of TIA  '[]'$ Aphasia   '[]'$ Vissual changes   '[]'$ Weakness or numbness in arm   '[]'$ Weakness or numbness in leg ?Musculoskeletal:   '[]'$ Joint swelling   '[]'$ Joint pain   '[]'$ Low back pain ?Hematologic:  '[]'$ Easy bruising  '[]'$ Easy bleeding   '[]'$ Hypercoagulable state   '[]'$ Anemic ?Gastrointestinal:  '[]'$ Diarrhea   '[]'$ Vomiting  '[]'$ Gastroesophageal reflux/heartburn   '[]'$ Difficulty swallowing. ?Genitourinary:  '[]'$ Chronic kidney disease   '[]'$ Difficult urination  '[]'$ Frequent urination   '[]'$ Blood in urine ?Skin:  '[]'$ Rashes   '[]'$ Ulcers  ?Psychological:  '[]'$ History of anxiety   '[]'$  History of major depression. ? ?Physical Examination ? ?Vitals:  ? 12/10/21 1256  ?BP: 139/78  ?Pulse: 69  ?Resp: 16  ?Weight: 156 lb (70.8 kg)  ?Height: 6' (1.829 m)  ? ?Body mass index  is 21.16 kg/m?. ?Gen: WD/WN, NAD ?Head: [place]/AT, No temporalis wasting.  ?Ear/Nose/Throat: Hearing grossly intact, nares w/o erythema or drainage ?Eyes: PER, EOMI, sclera nonicteric.  ?Neck: Supple, no masses.  No bruit or JVD.  ?Pulmonary:  Good air movement, no audible wheezing, no use of accessory muscles.  ?Cardiac: RRR, normal S1, S2, no Murmurs. ?Vascular:  mild trophic changes, no open wounds ?Vessel Right Left  ?Radial Palpable Palpable  ?PT Not Palpable Not Palpable  ?DP Not Palpable Not Palpable  ?Gastrointestinal: soft, non-distended. No guarding/no peritoneal signs.  ?Musculoskeletal: M/S 5/5 throughout.  No visible deformity.  ?Neurologic: CN 2-12 intact. Pain and light touch intact in extremities.  Symmetrical.  Speech is fluent. Motor exam as listed above. ?Psychiatric: Judgment intact, Mood & affect appropriate for pt's clinical situation. ?Dermatologic: No rashes or ulcers noted.  No changes consistent with cellulitis. ? ? ?CBC ?Lab Results  ?Component Value Date  ? WBC 3.0 (L) 07/27/2021  ? HGB 14.1 07/27/2021  ? HCT 40.7 07/27/2021  ? MCV 88 07/27/2021  ? PLT 190 07/27/2021  ? ? ?BMET ?   ?Component Value Date/Time  ? NA 141 05/21/2021 0946  ? NA 131 (L) 07/20/2013 1301  ? K 4.5 05/21/2021 0946  ? K 3.5 07/20/2013 1301  ? CL 102 05/21/2021 0946  ? CL 97 (L) 07/20/2013 1301  ? CO2 24 05/21/2021 0946  ? CO2 32 07/20/2013 1301  ? GLUCOSE 87 05/21/2021 0946  ? GLUCOSE 93 10/12/2017 1014  ? GLUCOSE 124 (H) 07/20/2013 1301  ? BUN 9 05/21/2021 0946  ? BUN 12 07/20/2013 1301  ? CREATININE 1.20 12/07/2021 0752  ? CREATININE 1.22 07/20/2013 1301  ? CALCIUM 9.9 05/21/2021 0946  ? CALCIUM 8.8 07/20/2013 1301  ? GFRNONAA 60 03/18/2020 0957  ? GFRNONAA 60 (L) 07/20/2013 1301  ? GFRAA 69 03/18/2020 0957  ? GFRAA >60 07/20/2013 1301  ? ?Estimated Creatinine Clearance: 50 mL/min (by C-G formula based on SCr of 1.2 mg/dL). ? ?COAG ?Lab Results  ?Component Value Date  ? INR 1.1 01/25/2013  ? ? ?Radiology ?VAS [place] AAA  DUPLEX ? ?Result Date: 11/16/2021 ?ABDOMINAL AORTA STUDY Patient Name:  DEMONTA WOMBLES  Date of Exam:   11/12/2021 Medical Rec #: [id number]        Accession #:    [id number] Date of Birth: 05/21/1941        Patient Gender: M Patient Age:   36 years Exam Location:  [facility] Procedure:      VAS [place] AAA DUPLEX Referring Phys: Eulogio Ditch --------------------------------------------------------------------------------  Indications: Follow up exam for known AAA.  Comparison Study: 10/04/17: 3.4cm AAA  03/20/19: 3.4cm AAA                   04/29/20: 3.71cm AAA                   06/24/21: 4.39cm AAA Performing Technologist: Blondell Reveal RT, RDMS, RVT  Examination Guidelines: A complete evaluation includes B-mode imaging, spectral Doppler, color Doppler, and power Doppler as needed of all accessible portions of each vessel. Bilateral testing is considered an integral part of a complete examination. Limited examinations for reoccurring indications may be performed as noted.  Abdominal Aorta Findings: +-----------+-------+----------+----------+--------+--------+--------+ Location   AP (cm)Trans (cm)PSV (cm/s)WaveformThrombusComments +-----------+-------+----------+----------+--------+--------+--------+ Proximal   2.61   2.76      56        biphasic                 +-----------+-------+----------+----------+--------+--------+--------+ Mid        2.42   2.48      70        biphasic                 +-----------+-------+----------+----------+--------+--------+--------+ Distal     5.32   4.90      29        biphasicPresent          +-----------+-------+----------+----------+--------+--------+--------+ RT CIA Prox1.6    1.5       78        biphasic                 +-----------+-------+----------+----------+--------+--------+--------+ LT CIA Prox1.5    1.6       75        biphasic                  +-----------+-------+----------+----------+--------+--------+--------+  Summary: Abdominal Aorta: There is evidence of abnormal dilatation of the infrarenal Abdominal aorta with mild bilateral CIA ectasia. The largest aortic diameter has increased compared to prior exam. Previous diameter measurement was 4.4 cm

## 2021-12-10 NOTE — H&P (View-Only) (Signed)
? ? ? ? ?MRN : [id number] ? ?Dale Becker is a 81 y.o. (1940-10-23) male who presents with chief complaint of check circulation. ? ?History of Present Illness: The patient is seen for follow up evaluation of AAA status post CTA. There were no problems or complications related to the CT scan. The patient denies interval development of abdominal or back pain. No new lower extremity pain or discoloration of the toes.  ? ?The patient denies recent episodes of angina or shortness of breath. The patient denies interval anaurosis fugax. There is no recent history of TIA symptoms or focal motor deficits. The patient denies PAD or claudication symptoms.  ? ?CT angiography of the abdomen and pelvis shows an infrarenal AAA 5.8 cm.  ? ?Current Meds  ?Medication Sig  ? amLODipine (NORVASC) 5 MG tablet Take by mouth.  ? aspirin 81 MG tablet Take 81 mg by mouth daily.  ? ezetimibe (ZETIA) 10 MG tablet Take 10 mg by mouth daily.  ? NITROSTAT 0.4 MG SL tablet Place 0.4 mg under the tongue every 5 (five) minutes as needed.   ? pravastatin (PRAVACHOL) 40 MG tablet TAKE 1 TABLET BY MOUTH ONCE DAILY IN THE EVENING  ? ? ?Past Medical History:  ?Diagnosis Date  ? AAA (abdominal aortic aneurysm) ([place])   ? a. 09/2016 Ao Duplex: 3.2 x 3.4 cm juxtarenal AAA - 3.1 cm in length; b. 09/2017 Ao Duplex: largest Ao measurement 3.4cm. Overall stable.  ? Brain tumor ([place])   ? Coronary artery disease 01/2013  ? Presented in June of 2014 with non-ST elevation myocardial infarction(peak troponin of 22). Cardiac catheterization showed moderate nonobstructive disease in the left circumflex and proximal RCA with an FFR ratio of 0.88 and 0.90 respectively. Ejection fraction was 70% and hyperdynamic. MI was likely due to plaque rupture without obstructive disease.   ? History of echocardiogram   ? a. 02/2013 Echo: EF 55-60%, mild TR.  ? Hyperlipidemia   ? Hypertension   ? MI (myocardial infarction) ([place]) 01/2013  ? Tobacco use   ? ? ?Past Surgical History:   ?Procedure Laterality Date  ? BRAIN TUMOR EXCISION    ? CARDIAC CATHETERIZATION  01/2013  ? [facility];EF 70 %  ? ? ?Social History ?Social History  ? ?Tobacco Use  ? Smoking status: Every Day  ?  Packs/day: 1.00  ?  Years: 50.00  ?  Pack years: 50.00  ?  Types: Cigarettes  ? Smokeless tobacco: Never  ?Vaping Use  ? Vaping Use: Never used  ?Substance Use Topics  ? Alcohol use: No  ? Drug use: No  ? ? ?Family History ?Family History  ?Family history unknown: Yes  ? ? ?Allergies  ?Allergen Reactions  ? Atorvastatin   ?  Myalgias ?  ? Lisinopril Other (See Comments)  ?  Angioedema.  ? Rosuvastatin   ?  Myalgias ?  ? Simvastatin   ?  Myalgias ?  ? ? ? ?REVIEW OF SYSTEMS (Negative unless checked) ? ?Constitutional: '[]'$ Weight loss  '[]'$ Fever  '[]'$ Chills ?Cardiac: '[]'$ Chest pain   '[]'$ Chest pressure   '[]'$ Palpitations   '[]'$ Shortness of breath when laying flat   '[]'$ Shortness of breath with exertion. ?Vascular:  '[x]'$ Pain in legs with walking   '[]'$ Pain in legs at rest  '[]'$ History of DVT   '[]'$ Phlebitis   '[]'$ Swelling in legs   '[]'$ Varicose veins   '[]'$ Non-healing ulcers ?Pulmonary:   '[]'$ Uses home oxygen   '[]'$ Productive cough   '[]'$ Hemoptysis   '[]'$ Wheeze  '[]'$ COPD   '[]'$ Asthma ?Neurologic:  '[]'$   Dizziness   '[]'$ Seizures   '[]'$ History of stroke   '[]'$ History of TIA  '[]'$ Aphasia   '[]'$ Vissual changes   '[]'$ Weakness or numbness in arm   '[]'$ Weakness or numbness in leg ?Musculoskeletal:   '[]'$ Joint swelling   '[]'$ Joint pain   '[]'$ Low back pain ?Hematologic:  '[]'$ Easy bruising  '[]'$ Easy bleeding   '[]'$ Hypercoagulable state   '[]'$ Anemic ?Gastrointestinal:  '[]'$ Diarrhea   '[]'$ Vomiting  '[]'$ Gastroesophageal reflux/heartburn   '[]'$ Difficulty swallowing. ?Genitourinary:  '[]'$ Chronic kidney disease   '[]'$ Difficult urination  '[]'$ Frequent urination   '[]'$ Blood in urine ?Skin:  '[]'$ Rashes   '[]'$ Ulcers  ?Psychological:  '[]'$ History of anxiety   '[]'$  History of major depression. ? ?Physical Examination ? ?Vitals:  ? 12/10/21 1256  ?BP: 139/78  ?Pulse: 69  ?Resp: 16  ?Weight: 156 lb (70.8 kg)  ?Height: 6' (1.829 m)  ? ?Body mass index  is 21.16 kg/m?. ?Gen: WD/WN, NAD ?Head: [place]/AT, No temporalis wasting.  ?Ear/Nose/Throat: Hearing grossly intact, nares w/o erythema or drainage ?Eyes: PER, EOMI, sclera nonicteric.  ?Neck: Supple, no masses.  No bruit or JVD.  ?Pulmonary:  Good air movement, no audible wheezing, no use of accessory muscles.  ?Cardiac: RRR, normal S1, S2, no Murmurs. ?Vascular:  mild trophic changes, no open wounds ?Vessel Right Left  ?Radial Palpable Palpable  ?PT Not Palpable Not Palpable  ?DP Not Palpable Not Palpable  ?Gastrointestinal: soft, non-distended. No guarding/no peritoneal signs.  ?Musculoskeletal: M/S 5/5 throughout.  No visible deformity.  ?Neurologic: CN 2-12 intact. Pain and light touch intact in extremities.  Symmetrical.  Speech is fluent. Motor exam as listed above. ?Psychiatric: Judgment intact, Mood & affect appropriate for pt's clinical situation. ?Dermatologic: No rashes or ulcers noted.  No changes consistent with cellulitis. ? ? ?CBC ?Lab Results  ?Component Value Date  ? WBC 3.0 (L) 07/27/2021  ? HGB 14.1 07/27/2021  ? HCT 40.7 07/27/2021  ? MCV 88 07/27/2021  ? PLT 190 07/27/2021  ? ? ?BMET ?   ?Component Value Date/Time  ? NA 141 05/21/2021 0946  ? NA 131 (L) 07/20/2013 1301  ? K 4.5 05/21/2021 0946  ? K 3.5 07/20/2013 1301  ? CL 102 05/21/2021 0946  ? CL 97 (L) 07/20/2013 1301  ? CO2 24 05/21/2021 0946  ? CO2 32 07/20/2013 1301  ? GLUCOSE 87 05/21/2021 0946  ? GLUCOSE 93 10/12/2017 1014  ? GLUCOSE 124 (H) 07/20/2013 1301  ? BUN 9 05/21/2021 0946  ? BUN 12 07/20/2013 1301  ? CREATININE 1.20 12/07/2021 0752  ? CREATININE 1.22 07/20/2013 1301  ? CALCIUM 9.9 05/21/2021 0946  ? CALCIUM 8.8 07/20/2013 1301  ? GFRNONAA 60 03/18/2020 0957  ? GFRNONAA 60 (L) 07/20/2013 1301  ? GFRAA 69 03/18/2020 0957  ? GFRAA >60 07/20/2013 1301  ? ?Estimated Creatinine Clearance: 50 mL/min (by C-G formula based on SCr of 1.2 mg/dL). ? ?COAG ?Lab Results  ?Component Value Date  ? INR 1.1 01/25/2013  ? ? ?Radiology ?VAS [place] AAA  DUPLEX ? ?Result Date: 11/16/2021 ?ABDOMINAL AORTA STUDY Patient Name:  FARIS COOLMAN  Date of Exam:   11/12/2021 Medical Rec #: [id number]        Accession #:    [id number] Date of Birth: Feb 07, 1941        Patient Gender: M Patient Age:   30 years Exam Location:  [facility] Procedure:      VAS [place] AAA DUPLEX Referring Phys: Eulogio Ditch --------------------------------------------------------------------------------  Indications: Follow up exam for known AAA.  Comparison Study: 10/04/17: 3.4cm AAA  03/20/19: 3.4cm AAA                   04/29/20: 3.71cm AAA                   06/24/21: 4.39cm AAA Performing Technologist: Blondell Reveal RT, RDMS, RVT  Examination Guidelines: A complete evaluation includes B-mode imaging, spectral Doppler, color Doppler, and power Doppler as needed of all accessible portions of each vessel. Bilateral testing is considered an integral part of a complete examination. Limited examinations for reoccurring indications may be performed as noted.  Abdominal Aorta Findings: +-----------+-------+----------+----------+--------+--------+--------+ Location   AP (cm)Trans (cm)PSV (cm/s)WaveformThrombusComments +-----------+-------+----------+----------+--------+--------+--------+ Proximal   2.61   2.76      56        biphasic                 +-----------+-------+----------+----------+--------+--------+--------+ Mid        2.42   2.48      70        biphasic                 +-----------+-------+----------+----------+--------+--------+--------+ Distal     5.32   4.90      29        biphasicPresent          +-----------+-------+----------+----------+--------+--------+--------+ RT CIA Prox1.6    1.5       78        biphasic                 +-----------+-------+----------+----------+--------+--------+--------+ LT CIA Prox1.5    1.6       75        biphasic                  +-----------+-------+----------+----------+--------+--------+--------+  Summary: Abdominal Aorta: There is evidence of abnormal dilatation of the infrarenal Abdominal aorta with mild bilateral CIA ectasia. The largest aortic diameter has increased compared to prior exam. Previous diameter measurement was 4.4 cm

## 2021-12-11 ENCOUNTER — Ambulatory Visit (INDEPENDENT_AMBULATORY_CARE_PROVIDER_SITE_OTHER): Payer: 59 | Admitting: Nurse Practitioner

## 2021-12-11 ENCOUNTER — Encounter: Payer: Self-pay | Admitting: Nurse Practitioner

## 2021-12-11 VITALS — BP 128/70 | HR 66 | Ht 66.5 in | Wt 158.2 lb

## 2021-12-11 DIAGNOSIS — I2510 Atherosclerotic heart disease of native coronary artery without angina pectoris: Secondary | ICD-10-CM

## 2021-12-11 DIAGNOSIS — Z01818 Encounter for other preprocedural examination: Secondary | ICD-10-CM

## 2021-12-11 DIAGNOSIS — E782 Mixed hyperlipidemia: Secondary | ICD-10-CM

## 2021-12-11 DIAGNOSIS — Z720 Tobacco use: Secondary | ICD-10-CM

## 2021-12-11 DIAGNOSIS — I10 Essential (primary) hypertension: Secondary | ICD-10-CM

## 2021-12-11 DIAGNOSIS — I7143 Infrarenal abdominal aortic aneurysm, without rupture: Secondary | ICD-10-CM

## 2021-12-11 NOTE — Progress Notes (Signed)
? ? ?Office Visit  ?  ?Patient Name: Dale Becker ?Date of Encounter: 12/11/2021 ? ?Primary Care Provider:  Juluis Pitch, MD ?Primary Cardiologist:  Kathlyn [place], MD ? ?Chief Complaint  ?  ?81 y/o ? w/ a h/o CAD s/p prior MI, HTN, HL, tob abuse, AAA, and brain tumor s/p resection, who presents for follow-up related to preoperative evaluation pending EVAR. ? ?Past Medical History  ?  ?Past Medical History:  ?Diagnosis Date  ? AAA (abdominal aortic aneurysm) ([place])   ? a. 09/2016 Ao Duplex: 3.2 x 3.4 cm juxtarenal AAA - 3.1 cm in length; b. 09/2017 Ao Duplex: 3.4cm; c. 05/2021 Ao Duplex: 4.4cm; d. 11/2021 CTA: 5.8cm  ? Brain tumor ([place])   ? Coronary artery disease 01/2013  ? Presented in June of 2014 with non-ST elevation myocardial infarction(peak troponin of 22). Cardiac catheterization showed moderate nonobstructive disease in the left circumflex and proximal RCA with an FFR ratio of 0.88 and 0.90 respectively. Ejection fraction was 70% and hyperdynamic. MI was likely due to plaque rupture without obstructive disease.   ? History of echocardiogram   ? a. 02/2013 Echo: EF 55-60%, mild TR.  ? Hyperlipidemia   ? Hypertension   ? MI (myocardial infarction) ([place]ale Becker) 01/2013  ? Tobacco use   ? ?Past Surgical History:  ?Procedure Laterality Date  ? BRAIN TUMOR EXCISION    ? CARDIAC CATHETERIZATION  01/2013  ? [facility];EF 70 %  ? ? ?Allergies ? ?Allergies  ?Allergen Reactions  ? Atorvastatin   ?  Myalgias ?  ? Lisinopril Other (See Comments) and Hives  ?  Angioedema. ?Angioedema.  ? Rosuvastatin   ?  Myalgias ?  ? Simvastatin   ?  Myalgias ?  ? ? ?History of Present Illness  ?  ?81 year old male with above past medical history including CAD status post non-STEMI in June 2014 with catheterization at that time showing moderate, nonobstructive disease in the left circumflex and proximal RCA.  The left circumflex appears slightly hazy and was felt to possibly represent a site of plaque rupture.  Fractional flow reserve in each  area was normal, and LV function was normal.  He was medically managed.  Did not tolerate high potency statin therapy secondary to myalgias, and has been maintained on pravastatin.  Other history includes hypertension, hyperlipidemia, tobacco abuse, abdominal aortic aneurysm, angioedema in the setting of lisinopril therapy, and prior history of brain tumor status postresection. ? ?Dale Becker was last seen in cardiology clinic in October 2022, at which time he felt well.  Following his visit, he underwent repeat abdominal aortic ultrasound which showed a 4.4 cm abdominal aortic aneurysm.  Given growth of greater than 0.5 cm in a year, he was referred to vascular surgery.  Most recent ultrasound in March 2023 showed increase in size to 5.3 cm and this was followed by CTA of the abdomen that showed a 5.8 cm infrarenal abdominal aortic aneurysm.  He was seen by vascular surgery yesterday with plan for EVAR next week.  He presents for preoperative valuation today.  Patient denies chest pain but performs very little activity.  He sits most of the day.  He has some degree of chronic dyspnea on exertion.  He denies chest pain, palpitations, PND, orthopnea, dizziness, syncope, edema, or early satiety.  He continues to smoke about 2 packs of cigarettes every 3 days. ? ?Home Medications  ?  ?Current Outpatient Medications  ?Medication Sig Dispense Refill  ? amLODipine (NORVASC) 5 MG tablet Take by mouth.    ?  aspirin 81 MG tablet Take 81 mg by mouth daily.    ? ezetimibe (ZETIA) 10 MG tablet Take 10 mg by mouth daily.    ? NITROSTAT 0.4 MG SL tablet Place 0.4 mg under the tongue every 5 (five) minutes as needed.     ? pravastatin (PRAVACHOL) 40 MG tablet TAKE 1 TABLET BY MOUTH ONCE DAILY IN THE EVENING 30 tablet 9  ? ?No current facility-administered medications for this visit.  ?  ? ?Review of Systems  ?  ?Limited activity and some degree of chronic dyspnea exertion.  He denies chest pain, palpitations, PND, orthopnea,  dizziness, syncope, edema, or early satiety..  All other systems reviewed and are otherwise negative except as noted above. ?  ? ?Physical Exam  ?  ?VS:  BP 128/70 (BP Location: Left Arm, Patient Position: Sitting, Cuff Size: Normal)   Pulse 66   Ht 5' 6.5" (1.689 m)   Wt 158 lb 4 oz (71.8 kg)   SpO2 96%   BMI 25.16 kg/m?  , BMI Body mass index is 25.16 kg/m?. ?    ?GEN: Well nourished, well developed, in no acute distress. ?HEENT: normal. ?Neck: Supple, no JVD, carotid bruits, or masses. ?Cardiac: RRR, no murmurs, rubs, or gallops. No clubbing, cyanosis, edema.  Radials 2+/PT 1+ and equal bilaterally.  ?Respiratory:  Respirations regular and unlabored, diminished breath sounds bilaterally. ?GI: Soft, nontender, nondistended, BS + x 4. ?MS: no deformity or atrophy. ?Skin: warm and dry, no rash. ?Neuro:  Strength and sensation are intact. ?Psych: Normal affect. ? ?Accessory Clinical Findings  ?  ?ECG personally reviewed by me today -regular sinus rhythm, 66, left axis deviation, right bundle branch block- no acute changes. ? ?Lab Results  ?Component Value Date  ? WBC 3.0 (L) 07/27/2021  ? HGB 14.1 07/27/2021  ? HCT 40.7 07/27/2021  ? MCV 88 07/27/2021  ? PLT 190 07/27/2021  ? ?Lab Results  ?Component Value Date  ? CREATININE 1.20 12/07/2021  ? BUN 9 05/21/2021  ? NA 141 05/21/2021  ? K 4.5 05/21/2021  ? CL 102 05/21/2021  ? CO2 24 05/21/2021  ? ?Lab Results  ?Component Value Date  ? ALT 14 07/27/2021  ? AST 15 07/27/2021  ? ALKPHOS 86 07/27/2021  ? BILITOT 0.5 07/27/2021  ? ?Lab Results  ?Component Value Date  ? CHOL 121 07/27/2021  ? HDL 32 (L) 07/27/2021  ? [place] 74 07/27/2021  ? TRIG 72 07/27/2021  ? CHOLHDL 3.8 07/27/2021  ?  ?Lab Results  ?Component Value Date  ? HGBA1C 5.7 01/26/2013  ? ? ?Assessment & Plan  ?  ?1.  Abdominal aortic aneurysm/preoperative cardiovascular evaluation: Patient with somewhat rapid progression of AAA over the past 2 years.  Now followed by vascular surgery and CTA yesterday  showed a 5.8 cm infrarenal abdominal aortic and is not.  Patient is asymptomatic.  Heart rate and blood pressure are reasonably well controlled.  He is pending EVAR next week.  From a cardiovascular standpoint, patient does not experience chest pain but does have some degree of chronic dyspnea on exertion and otherwise very limited activity.  Peak achievable METS appears to be about 3.5.  Preoperative risk calculates to approximately 6.6%.  With poor activity tolerance, will obtain a Lexiscan Myoview to rule out ischemia and risk stratify.  This has been scheduled for early next Monday. ? ?2.  Coronary artery disease: Status post non-STEMI in June 2014 with nonobstructive circumflex and RCA disease and question of plaque rupture  in the circumflex.  He has been medically managed.  He denies chest pain though does have some degree of chronic dyspnea on exertion.  Stress testing as outlined above for risk stratification.  Continue statin and Zetia therapy. ? ?3.  Essential hypertension: Stable on amlodipine therapy. ? ?4.  Hyperlipidemia: Previous intolerant to high potency statin therapy, but does tolerate pravastatin and Zetia.  LDL was 74 in December 2022. ? ?5.  Ongoing tobacco abuse: Still smoking about 2 packs of cigarettes every 3 days.  Complete cessation advised. ? ?6.  Disposition: Follow-up stress testing as outlined above.  Provided that this is low risk, follow-up in clinic in 6 months or sooner if necessary. ? ?Murray Hodgkins, NP ?12/11/2021, 5:03 PM ? ?

## 2021-12-11 NOTE — Patient Instructions (Addendum)
Medication Instructions:  ?No changes at this time.  ? ?*If you need a refill on your cardiac medications before your next appointment, please call your pharmacy* ? ? ?Lab Work: ?None ? ?If you have labs (blood work) drawn today and your tests are completely normal, you will receive your results only by: ?MyChart Message (if you have MyChart) OR ?A paper copy in the mail ?If you have any lab test that is abnormal or we need to change your treatment, we will call you to review the results. ? ? ?Testing/Procedures: ?[facility] MYOVIEW ? ?Your caregiver has ordered a Stress Test with nuclear imaging. The purpose of this test is to evaluate the blood supply to your heart muscle. This procedure is referred to as a "Non-Invasive Stress Test." This is because other than having an IV started in your vein, nothing is inserted or "invades" your body. Cardiac stress tests are done to find areas of poor blood flow to the heart by determining the extent of coronary artery disease (CAD). Some patients exercise on a treadmill, which naturally increases the blood flow to your heart, while others who are  unable to walk on a treadmill due to physical limitations have a pharmacologic/chemical stress agent called Lexiscan . This medicine will mimic walking on a treadmill by temporarily increasing your coronary blood flow.  ? ?Please note: these test may take anywhere between 2-4 hours to complete ? ?PLEASE REPORT TO [facility] MEDICAL MALL ENTRANCE  ?THE VOLUNTEERS AT THE FIRST DESK WILL DIRECT YOU WHERE TO GO ? ?Date of Procedure:_____________________ ? ?Arrival Time for Procedure:___________________________ ? ?Instructions regarding medication:  ? ?_XX___ : You may take all of your medications with small sip of water.  ? ? ? ?PLEASE NOTIFY THE OFFICE AT LEAST 24 HOURS IN ADVANCE IF YOU ARE UNABLE TO KEEP YOUR APPOINTMENT.  [phone] ?AND  ?PLEASE NOTIFY NUCLEAR MEDICINE AT [facility] AT LEAST 48 HOURS IN ADVANCE IF YOU ARE UNABLE TO KEEP YOUR  APPOINTMENT. [phone] ? ?How to prepare for your Myoview test: ? ?Do not eat or drink after midnight ?No caffeine for 24 hours prior to test ?No smoking 24 hours prior to test. ?Your medication may be taken with water.  If your doctor stopped a medication because of this test, do not take that medication. ?Ladies, please do not wear dresses.  Skirts or pants are appropriate. Please wear a short sleeve shirt. ?No perfume, cologne or lotion. ?Wear comfortable walking shoes. No heels! ? ? ? ?Follow-Up: ?At [facility], you and your health needs are our priority.  As part of our continuing mission to provide you with exceptional heart care, we have created designated Provider Care Teams.  These Care Teams include your primary Cardiologist (physician) and Advanced Practice Providers (APPs -  Physician Assistants and Nurse Practitioners) who all work together to provide you with the care you need, when you need it. ? ? ?Your next appointment:   ?3 month(s) ? ?The format for your next appointment:   ?In Person ? ?Provider:   ?Kathlyn Sacramento, MD or Murray Hodgkins, NP  ? ? ? ? ? ?Important Information About Sugar ? ? ? ? ?  ?

## 2021-12-14 ENCOUNTER — Ambulatory Visit
Admission: RE | Admit: 2021-12-14 | Discharge: 2021-12-14 | Disposition: A | Discharge status: Home / Self Care | Payer: 59 | Source: Ambulatory Visit | Attending: Nurse Practitioner | Admitting: Nurse Practitioner

## 2021-12-14 DIAGNOSIS — Z01818 Encounter for other preprocedural examination: Secondary | ICD-10-CM | POA: Diagnosis not present

## 2021-12-14 DIAGNOSIS — I2510 Atherosclerotic heart disease of native coronary artery without angina pectoris: Secondary | ICD-10-CM | POA: Diagnosis not present

## 2021-12-14 LAB — NM MYOCAR MULTI W/SPECT W/WALL MOTION / EF
Angina Index: 0
Duke Treadmill Score: 6
Estimated workload: 6
Exercise duration (min): 6 min
Exercise duration (sec): 20 s
LV dias vol: 68 mL (ref 62–150)
LV sys vol: 23 mL
Nuc Stress EF: 66 %
Peak HR: 117 {beats}/min
Percent HR: 82 %
Rest HR: 57 {beats}/min
Rest Nuclear Isotope Dose: 10.5 mCi
SDS: 0
SRS: 4
SSS: 1
ST Depression (mm): 0 mm
Stress Nuclear Isotope Dose: 31.9 mCi
TID: 0.88

## 2021-12-14 MED ORDER — REGADENOSON 0.4 MG/5ML IV SOLN
0.4000 mg | Freq: Once | INTRAVENOUS | Status: DC
Start: 2021-12-14 — End: 2021-12-15

## 2021-12-14 MED ORDER — TECHNETIUM TC 99M TETROFOSMIN IV KIT
10.0000 | PACK | Freq: Once | INTRAVENOUS | Status: AC
  Administered 2021-12-14: 10.45 via INTRAVENOUS

## 2021-12-14 MED ORDER — TECHNETIUM TC 99M TETROFOSMIN IV KIT
31.8700 | PACK | Freq: Once | INTRAVENOUS | Status: AC | PRN
Start: 2021-12-14 — End: 2021-12-14
  Administered 2021-12-14: 31.87 via INTRAVENOUS

## 2021-12-15 ENCOUNTER — Telehealth (INDEPENDENT_AMBULATORY_CARE_PROVIDER_SITE_OTHER): Payer: Self-pay

## 2021-12-15 ENCOUNTER — Ambulatory Visit: Payer: 59 | Admitting: Cardiovascular Disease

## 2021-12-15 NOTE — Telephone Encounter (Signed)
Patient called and wanted to know about his upcoming surgery.  Please advise ?

## 2021-12-16 ENCOUNTER — Ambulatory Visit: Payer: 59 | Admitting: Physician Assistant

## 2021-12-16 NOTE — Telephone Encounter (Signed)
Spoke with the patient and made him aware that once authorization has been established I will call to talk about getting him scheduled for surgery. ?

## 2021-12-18 ENCOUNTER — Telehealth (INDEPENDENT_AMBULATORY_CARE_PROVIDER_SITE_OTHER): Payer: Self-pay

## 2021-12-18 NOTE — Telephone Encounter (Signed)
Spoke with the patient and gave him the information regarding his surgery with Dr. Delana Meyer on 12/30/21 at the [facility]. Patient is to arrive at 6:15 am to the [facility]. Pre-op phone call is on 12/22/21 between 8-1 pm. Pre-surgical instructions were discussed and will be mailed. ?

## 2021-12-22 ENCOUNTER — Other Ambulatory Visit: Payer: Self-pay

## 2021-12-22 ENCOUNTER — Other Ambulatory Visit (INDEPENDENT_AMBULATORY_CARE_PROVIDER_SITE_OTHER): Payer: Self-pay | Admitting: Nurse Practitioner

## 2021-12-22 ENCOUNTER — Other Ambulatory Visit
Admission: RE | Admit: 2021-12-22 | Discharge: 2021-12-22 | Disposition: A | Discharge status: Home / Self Care | Payer: 59 | Source: Ambulatory Visit | Attending: Vascular Surgery | Admitting: Vascular Surgery

## 2021-12-22 DIAGNOSIS — Z01812 Encounter for preprocedural laboratory examination: Secondary | ICD-10-CM

## 2021-12-22 DIAGNOSIS — I7143 Infrarenal abdominal aortic aneurysm, without rupture: Secondary | ICD-10-CM

## 2021-12-22 HISTORY — DX: Dyspnea, unspecified: R06.00

## 2021-12-22 HISTORY — DX: Unspecified osteoarthritis, unspecified site: M19.90

## 2021-12-22 NOTE — Patient Instructions (Addendum)
Your procedure is scheduled on: 12/30/21 - Wednesday ?Report to the Registration Desk on the 1st floor of the [place]. ?To find out your arrival time, please call [phone] between 1PM - 3PM on: 12/29/21 - Tuesday ?If your arrival time is 6:00 am, do not arrive prior to that time as the [place] entrance doors do not open until 6:00 am. ? ?REMEMBER: ?Instructions that are not followed completely may result in serious medical risk, up to and including death; or upon the discretion of your surgeon and anesthesiologist your surgery may need to be rescheduled. ? ?Do not eat food or drink any fluids after midnight the night before surgery.  ?No gum chewing, lozengers or hard candies. ? ?TAKE only THESE MEDICATIONS THE MORNING OF SURGERY WITH A SIP OF WATER: ? ?- amLODipine (NORVASC) 5 MG tablet ? ?One week prior to surgery: ?Stop Anti-inflammatories (NSAIDS) such as Advil, Aleve, Ibuprofen, Motrin, Naproxen, Naprosyn and Aspirin based products such as Excedrin, Goodys Powder, BC Powder. ? ?Stop ANY OVER THE COUNTER supplements until after surgery. ? ?You may take Tylenol if needed for pain up until the day of surgery. ? ?No Alcohol for 24 hours before or after surgery. ? ?No Smoking including e-cigarettes for 24 hours prior to surgery.  ?No chewable tobacco products for at least 6 hours prior to surgery.  ?No nicotine patches on the day of surgery. ? ?Do not use any "recreational" drugs for at least a week prior to your surgery.  ?Please be advised that the combination of cocaine and anesthesia may have negative outcomes, up to and including death. ?If you test positive for cocaine, your surgery will be cancelled. ? ?On the morning of surgery brush your teeth with toothpaste and water, you may rinse your mouth with mouthwash if you wish. ?Do not swallow any toothpaste or mouthwash. ? ?Use CHG Soap or wipes as directed on instruction sheet. ? ?Do not wear jewelry, make-up, hairpins, clips or nail  polish. ? ?Do not wear lotions, powders, or perfumes.  ? ?Do not shave body from the neck down 48 hours prior to surgery just in case you cut yourself which could leave a site for infection.  ?Also, freshly shaved skin may become irritated if using the CHG soap. ? ?Contact lenses, hearing aids and dentures may not be worn into surgery. ? ?Do not bring valuables to the hospital. [facility] is not responsible for any missing/lost belongings or valuables.  ? ?Notify your doctor if there is any change in your medical condition (cold, fever, infection). ? ?Wear comfortable clothing (specific to your surgery type) to the hospital. ? ?After surgery, you can help prevent lung complications by doing breathing exercises.  ?Take deep breaths and cough every 1-2 hours. Your doctor may order a device called an Incentive Spirometer to help you take deep breaths. ?When coughing or sneezing, hold a pillow firmly against your incision with both hands. This is called ?splinting.? Doing this helps protect your incision. It also decreases belly discomfort. ? ?If you are being admitted to the hospital overnight, leave your suitcase in the car. ?After surgery it may be brought to your room. ? ?If you are being discharged the day of surgery, you will not be allowed to drive home. ?You will need a responsible adult (18 years or older) to drive you home and stay with you that night.  ? ?If you are taking public transportation, you will need to have a responsible adult (18 years or older)  with you. ?Please confirm with your physician that it is acceptable to use public transportation.  ? ?Please call the [place] Dept. at [phone] if you have any questions about these instructions. ? ?Surgery Visitation Policy: ? ?Patients undergoing a surgery or procedure may have two family members or support persons with them as long as the person is not COVID-19 positive or experiencing its symptoms.  ? ?Inpatient Visitation:    ? ?Visiting hours are 7 a.m. to 8 p.m. ?Up to four visitors are allowed at one time in a patient room, including children. The visitors may rotate out with other people during the day. One designated support person (adult) may remain overnight.  ?

## 2021-12-23 ENCOUNTER — Encounter: Payer: Self-pay | Admitting: Urgent Care

## 2021-12-23 ENCOUNTER — Other Ambulatory Visit
Admission: RE | Admit: 2021-12-23 | Discharge: 2021-12-23 | Disposition: A | Discharge status: Home / Self Care | Payer: 59 | Source: Ambulatory Visit | Attending: Vascular Surgery | Admitting: Vascular Surgery

## 2021-12-23 DIAGNOSIS — Z01810 Encounter for preprocedural cardiovascular examination: Secondary | ICD-10-CM | POA: Diagnosis not present

## 2021-12-23 DIAGNOSIS — I7143 Infrarenal abdominal aortic aneurysm, without rupture: Secondary | ICD-10-CM | POA: Diagnosis not present

## 2021-12-23 DIAGNOSIS — Z01818 Encounter for other preprocedural examination: Secondary | ICD-10-CM | POA: Diagnosis present

## 2021-12-23 DIAGNOSIS — Z01812 Encounter for preprocedural laboratory examination: Secondary | ICD-10-CM

## 2021-12-23 LAB — CBC WITH DIFFERENTIAL/PLATELET
Abs Immature Granulocytes: 0.01 10*3/uL (ref 0.00–0.07)
Basophils Absolute: 0 10*3/uL (ref 0.0–0.1)
Basophils Relative: 1 %
Eosinophils Absolute: 0.1 10*3/uL (ref 0.0–0.5)
Eosinophils Relative: 3 %
HCT: 40.3 % (ref 39.0–52.0)
Hemoglobin: 13.4 g/dL (ref 13.0–17.0)
Immature Granulocytes: 0 %
Lymphocytes Relative: 27 %
Lymphs Abs: 0.9 10*3/uL (ref 0.7–4.0)
MCH: 29.3 pg (ref 26.0–34.0)
MCHC: 33.3 g/dL (ref 30.0–36.0)
MCV: 88 fL (ref 80.0–100.0)
Monocytes Absolute: 0.3 10*3/uL (ref 0.1–1.0)
Monocytes Relative: 10 %
Neutro Abs: 1.9 10*3/uL (ref 1.7–7.7)
Neutrophils Relative %: 59 %
Platelets: 180 10*3/uL (ref 150–400)
RBC: 4.58 MIL/uL (ref 4.22–5.81)
RDW: 13.8 % (ref 11.5–15.5)
WBC: 3.2 10*3/uL — ABNORMAL LOW (ref 4.0–10.5)
nRBC: 0 % (ref 0.0–0.2)

## 2021-12-23 LAB — BASIC METABOLIC PANEL
Anion gap: 5 (ref 5–15)
BUN: 10 mg/dL (ref 8–23)
CO2: 27 mmol/L (ref 22–32)
Calcium: 9.2 mg/dL (ref 8.9–10.3)
Chloride: 107 mmol/L (ref 98–111)
Creatinine, Ser: 1.11 mg/dL (ref 0.61–1.24)
GFR, Estimated: >60 mL/min (ref >60)
Glucose, Bld: 89 mg/dL (ref 70–99)
Potassium: 3.5 mmol/L (ref 3.5–5.1)
Sodium: 139 mmol/L (ref 135–145)

## 2021-12-23 LAB — SURGICAL PCR SCREEN
MRSA, PCR: NEGATIVE
Staphylococcus aureus: NEGATIVE

## 2021-12-23 LAB — TYPE AND SCREEN
ABO/RH(D): O POS
Antibody Screen: NEGATIVE

## 2021-12-25 ENCOUNTER — Encounter: Payer: Self-pay | Admitting: Vascular Surgery

## 2021-12-25 NOTE — Progress Notes (Signed)
?Perioperative Services ? ?Pre-Admission/Anesthesia Testing Clinical Review ? ?Date: 12/25/21 ? ?Patient Demographics:  ?Name: Dale Becker ?DOB:   Jul 07, 1941 ?MRN:   [id number] ? ?Planned Surgical Procedure(s):  ? ? Case: [id number] Date/Time: 12/30/21 0715  ? Procedure: ENDOVASCULAR REPAIR/STENT GRAFT  ? Anesthesia type: General  ? Diagnosis: Abdominal aortic aneurysm (AAA) without rupture, unspecified part ([place]) [I71.40]  ? Pre-op diagnosis: Endovascular Stent Repair   GORE    ANESTHESIA   AAA  ? Location: [facility]  ? Providers: Katha Cabal, MD  ? ?NOTE: Available PAT nursing documentation and vital signs have been reviewed. Clinical nursing staff has updated patient's PMH/PSHx, current medication list, and drug allergies/intolerances to ensure comprehensive history available to assist in medical decision making as it pertains to the aforementioned surgical procedure and anticipated anesthetic course. Extensive review of available clinical information performed. [place] PMH and PSHx updated with any diagnoses/procedures that  may have been inadvertently omitted during his intake with the pre-admission testing department's nursing staff. ? ?Clinical Discussion:  ?Dale Becker is a 81 y.o. male who is submitted for pre-surgical anesthesia review and clearance prior to him undergoing the above procedure. Patient is a Current Smoker (50 pack years). Pertinent PMH includes: CAD, NSTEMI, AAA, RBBB, aortic atherosclerosis, HTN, HLD, dyspnea, OA, brain tumor (s/p resection). ? ?Patient is followed by cardiology Fletcher Anon, MD). He was last seen in the cardiology clinic on 12/11/2021; notes reviewed.  At the time of his clinic visit, patient feeling well overall.  He denied any episodes of chest pain, however he had chronic exertional dyspnea related to his ongoing smoking history. He denied any episodes of PND, orthopnea, palpitations, significant, vertiginous symptoms, or presyncope/syncope.   Patient with past history significant for cardiovascular diagnoses. ? ?Patient suffered an NSTEMI in June 2014.  Diagnostic left heart catheterization was performed 01/25/2013 revealing a hyperdynamic left ventricular systolic function with EF 70%.  There was multivessel CAD; 20% mid LAD, 21, 40% ostial D2, 60% mid LCx, 60% proximal RCA, 20% mid RCA, and 20% RPLV.  Intervention was deferred opting for medical management as cardiology felt that MI was due to be secondary to a plaque rupture rather than obstructive disease. ? ?TTE performed on 01/25/2013 revealed normal left ventricular systolic function with an EF of 55-60%.  Right ventricular size and systolic function normal.  RVSP normal.  There was mild tricuspid valve regurgitation.  There was no evidence of a transvalvular gradient to suggest stenosis.  There was no pericardial effusion. ? 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 This is being routinely monitored over the course of approximately the last 5 years. Last imaging study was performed on 11/17/2021 with a noted interval increase in size of the falciform infrarenal aneurysm to 5.8 x 5.1 cm. ? ?Myocardial perfusion imaging study was performed on 12/14/2021 revealing normal left trickle systolic function with an EF of 55-60%. There was a small apical defect that was felt to be likely secondary to artifact.  CT attenuation images revealed moderate aortic and coronary calcifications. There was no evidence of stress-induced myocardial ischemia or arrhythmia.  Study determined to be normal and low risk. ? ?Blood pressure well controlled at 128/70 CCB monotherapy.  Patient is on a statin + ezetimibe for his HLD diagnosis and further ASCVD prevention.  He is not diabetic. Patient sedentary for the most part spending most of his day sitting down. Functional capacity, as defined by DASI, is documented  as being </= 4 METS. No changes were made  to his medication regimen during the last visit with his cardiologist.  Patient to follow-up with outpatient cardiology and 6 months or sooner if needed.  ? ?Dale Becker is scheduled for an ENDOVASCULAR REPAIR/STENT GRAFT (EVAR) on 12/30/2021 with Dr. Hortencia Pilar, MD. Given patient's past medical history significant for cardiovascular diagnoses, presurgical cardiac clearance was sought by the performing surgeon's office and PAT team. Per cardiology, "from a cardiovascular standpoint, patient does not experience chest pain but does have some degree of chronic dyspnea on exertion and otherwise very limited activity. Peak achievable METS appears to be about 3.5. Preoperative risk calculates to approximately 6.6% risk of 30 day MACE. His recent Lexiscan was normal, and therefore patient may proceed at an overall ACCEPTABLE risk of significant perioperative cardiovascular complications". In review of his medication reconciliation, it is noted the patient is on daily antiplatelet therapy.  Per standing orders from Dr. Delana Meyer for this procedure, patient will continue his daily low-dose ASA throughout his perioperative course. ? ?Patient denies previous perioperative complications with anesthesia in the past. In review of the EMR, there are no records available for review regarding patient's past surgical/anesthetic courses within the [facility] system. ? ? ?  12/11/2021  ?  2:41 PM 12/10/2021  ? 12:56 PM 11/12/2021  ?  9:07 AM  ?Vitals with BMI  ?Height 5' 6.5" '6\' 0"'$  '6\' 0"'$   ?Weight 158 lbs 4 oz 156 lbs 155 lbs 13 oz  ?BMI 25.16 21.15 21.13  ?Systolic 660 630 160  ?Diastolic 70 78 88  ?Pulse 66 69 59  ? ? ?Providers/Specialists:  ? ?NOTE: Primary physician provider listed below. Patient may have been seen by APP or partner within same practice.  ? ?PROVIDER ROLE / SPECIALTY LAST OV  ?Schnier, Dolores Lory, MD Vascular Surgery (Surgeon) 12/10/2021  ?Juluis Pitch, MD Primary Care Provider 09/22/2021  ?Kathlyn Sacramento,  MD Cardiology 12/11/2021  ? ?Allergies:  ?Atorvastatin, Lisinopril, Rosuvastatin, and Simvastatin ? ?Current Home Medications:  ? ?No current facility-administered medications for this encounter.  ? ? amLODipine (NORVASC) 5 MG tablet  ? aspirin 81 MG tablet  ? ezetimibe (ZETIA) 10 MG tablet  ? NITROSTAT 0.4 MG SL tablet  ? pravastatin (PRAVACHOL) 40 MG tablet  ? ibuprofen (ADVIL) 200 MG tablet  ? ?History:  ? ?Past Medical History:  ?Diagnosis Date  ? AAA (abdominal aortic aneurysm) ([place])   ? a.) 09/2016 Ao Duplex: 3.2 x 3.4 cm juxtarenal AAA - 3.1 cm in length; b.) 09/2017 Ao Duplex: 3.4cm; c.) 05/2021 Ao Duplex: 4.4cm; d.) 11/17/2021 CTA: 5.8 x 5.1 cm  ? Aortic atherosclerosis ([place])   ? Arthritis   ? Brain tumor [facility])   ? a.) s/p resection  ? Coronary artery disease 01/2013  ? a.) 01/2013 NSTEMI (peak troponin of 22). LHC showed moderate nonobstructive disease in the LCa and pRCA with an FFR ratio of 0.88 and 0.90 respectively. Hyperdamic EF of 70%. MI was likely due to plaque rupture without obstructive disease  ? Dyspnea   ? History of echocardiogram   ? a. 02/2013 Echo: EF 55-60%, mild TR.  ? Hyperlipidemia   ? Hypertension   ? NSTEMI (non-ST elevated myocardial infarction) ([place]) 01/2013  ? a.) LHC 01/25/2013: EF 70%; 20% mLAD, 20% oD1, 40% oD2, 60% mLCx, 60% pRCA; 20% mRCA, 20% RPLV; intervention deferred opting for medical mgmt as MI felt to be secondary to plaque rupture rather than obstructive disease.  ? Tobacco use   ? ?  Past Surgical History:  ?Procedure Laterality Date  ? BRAIN TUMOR EXCISION    ? CARDIAC CATHETERIZATION  01/25/2013  ? Procedure: CARDIAC CATHETHERIZATION; Location: [facility]; Surgeon: Kathlyn Sacramento, MD  ? ?Family History  ?Family history unknown: Yes  ? ?Social History  ? ?Tobacco Use  ? Smoking status: Every Day  ?  Packs/day: 1.00  ?  Years: 50.00  ?  Pack years: 50.00  ?  Types: Cigarettes  ? Smokeless tobacco: Never  ?Vaping Use  ? Vaping Use: Never used  ?Substance Use Topics  ? Alcohol  use: Yes  ?  Alcohol/week: 1.0 standard drink  ?  Types: 1 Cans of beer per week  ?  Comment: 1 beer week  ? Drug use: No  ? ? ?Pertinent Clinical Results:  ?LABS: Labs reviewed: Acceptable for surgery

## 2021-12-28 ENCOUNTER — Telehealth (INDEPENDENT_AMBULATORY_CARE_PROVIDER_SITE_OTHER): Payer: Self-pay

## 2021-12-28 NOTE — Telephone Encounter (Signed)
I attempted to contact the patient to find out if the patient is willing to be rescheduled to Tuesday 12/29/21 for his surgery. A message was left for return call. ?

## 2021-12-30 ENCOUNTER — Inpatient Hospital Stay
Admission: RE | Admit: 2021-12-30 | Discharge: 2021-12-31 | DRG: 269 | Disposition: A | Discharge status: Home / Self Care | Payer: 59 | Source: Ambulatory Visit | Attending: Vascular Surgery | Admitting: Vascular Surgery

## 2021-12-30 ENCOUNTER — Encounter: Payer: Self-pay | Admitting: Vascular Surgery

## 2021-12-30 ENCOUNTER — Inpatient Hospital Stay: Payer: 59 | Admitting: Urgent Care

## 2021-12-30 ENCOUNTER — Other Ambulatory Visit: Payer: Self-pay

## 2021-12-30 ENCOUNTER — Encounter
Admission: RE | Disposition: A | Discharge status: Home / Self Care | Payer: Self-pay | Source: Ambulatory Visit | Attending: Vascular Surgery

## 2021-12-30 DIAGNOSIS — R0602 Shortness of breath: Secondary | ICD-10-CM | POA: Diagnosis present

## 2021-12-30 DIAGNOSIS — I252 Old myocardial infarction: Secondary | ICD-10-CM

## 2021-12-30 DIAGNOSIS — Z7982 Long term (current) use of aspirin: Secondary | ICD-10-CM | POA: Diagnosis not present

## 2021-12-30 DIAGNOSIS — Z79899 Other long term (current) drug therapy: Secondary | ICD-10-CM | POA: Diagnosis not present

## 2021-12-30 DIAGNOSIS — I10 Essential (primary) hypertension: Secondary | ICD-10-CM | POA: Diagnosis present

## 2021-12-30 DIAGNOSIS — R0789 Other chest pain: Secondary | ICD-10-CM | POA: Diagnosis present

## 2021-12-30 DIAGNOSIS — I700 Atherosclerosis of aorta: Secondary | ICD-10-CM | POA: Diagnosis present

## 2021-12-30 DIAGNOSIS — F17210 Nicotine dependence, cigarettes, uncomplicated: Secondary | ICD-10-CM | POA: Diagnosis present

## 2021-12-30 DIAGNOSIS — I2510 Atherosclerotic heart disease of native coronary artery without angina pectoris: Secondary | ICD-10-CM | POA: Diagnosis present

## 2021-12-30 DIAGNOSIS — I7143 Infrarenal abdominal aortic aneurysm, without rupture: Secondary | ICD-10-CM | POA: Diagnosis present

## 2021-12-30 DIAGNOSIS — I714 Abdominal aortic aneurysm, without rupture, unspecified: Secondary | ICD-10-CM | POA: Diagnosis present

## 2021-12-30 DIAGNOSIS — Z888 Allergy status to other drugs, medicaments and biological substances status: Secondary | ICD-10-CM

## 2021-12-30 DIAGNOSIS — E785 Hyperlipidemia, unspecified: Secondary | ICD-10-CM | POA: Diagnosis present

## 2021-12-30 HISTORY — DX: Atherosclerosis of aorta: I70.0

## 2021-12-30 HISTORY — PX: ENDOVASCULAR REPAIR/STENT GRAFT: CATH118280

## 2021-12-30 LAB — CBC
HCT: 44.1 % (ref 39.0–52.0)
Hemoglobin: 14.6 g/dL (ref 13.0–17.0)
MCH: 29.6 pg (ref 26.0–34.0)
MCHC: 33.1 g/dL (ref 30.0–36.0)
MCV: 89.3 fL (ref 80.0–100.0)
Platelets: 171 10*3/uL (ref 150–400)
RBC: 4.94 MIL/uL (ref 4.22–5.81)
RDW: 13.8 % (ref 11.5–15.5)
WBC: 5.2 10*3/uL (ref 4.0–10.5)
nRBC: 0 % (ref 0.0–0.2)

## 2021-12-30 LAB — ABO/RH: ABO/RH(D): O POS

## 2021-12-30 LAB — BASIC METABOLIC PANEL
Anion gap: 10 (ref 5–15)
BUN: 10 mg/dL (ref 8–23)
CO2: 24 mmol/L (ref 22–32)
Calcium: 9.3 mg/dL (ref 8.9–10.3)
Chloride: 106 mmol/L (ref 98–111)
Creatinine, Ser: 1.08 mg/dL (ref 0.61–1.24)
GFR, Estimated: >60 mL/min (ref >60)
Glucose, Bld: 104 mg/dL — ABNORMAL HIGH (ref 70–99)
Potassium: 3.9 mmol/L (ref 3.5–5.1)
Sodium: 140 mmol/L (ref 135–145)

## 2021-12-30 LAB — MAGNESIUM: Magnesium: 2.1 mg/dL (ref 1.7–2.4)

## 2021-12-30 LAB — PROTIME-INR
INR: 1.2 (ref 0.8–1.2)
Prothrombin Time: 14.7 seconds (ref 11.4–15.2)

## 2021-12-30 LAB — APTT: aPTT: 196 seconds (ref 24–36)

## 2021-12-30 SURGERY — ENDOVASCULAR STENT GRAFT (AAA)
Anesthesia: General

## 2021-12-30 MED ORDER — GUAIFENESIN-DM 100-10 MG/5ML PO SYRP: 15.0000 mL | ORAL_SOLUTION | ORAL | Status: DC | PRN

## 2021-12-30 MED ORDER — DEXAMETHASONE SODIUM PHOSPHATE 10 MG/ML IJ SOLN
INTRAMUSCULAR | Status: AC
Start: 2021-12-30 — End: ?
  Filled 2021-12-30: qty 1

## 2021-12-30 MED ORDER — PHENOL 1.4 % MT LIQD: 1.0000 | OROMUCOSAL | Status: DC | PRN

## 2021-12-30 MED ORDER — PHENYLEPHRINE HCL-NACL 20-0.9 MG/250ML-% IV SOLN
INTRAVENOUS | Status: AC
Start: 2021-12-30 — End: ?
  Filled 2021-12-30: qty 250

## 2021-12-30 MED ORDER — OXYCODONE HCL 5 MG/5ML PO SOLN
5.0000 mg | Freq: Once | ORAL | Status: DC | PRN
  Filled 2021-12-30: qty 5

## 2021-12-30 MED ORDER — NITROGLYCERIN 0.4 MG SL SUBL: 0.4000 mg | SUBLINGUAL_TABLET | SUBLINGUAL | Status: DC | PRN

## 2021-12-30 MED ORDER — ONDANSETRON HCL 4 MG/2ML IJ SOLN
INTRAMUSCULAR | Status: AC
Start: 2021-12-30 — End: ?
  Filled 2021-12-30: qty 2

## 2021-12-30 MED ORDER — MORPHINE SULFATE (PF) 2 MG/ML IV SOLN: 2.0000 mg | INTRAVENOUS | Status: DC | PRN

## 2021-12-30 MED ORDER — ACETAMINOPHEN 10 MG/ML IV SOLN: 1000.0000 mg | Freq: Once | INTRAVENOUS | Status: DC | PRN

## 2021-12-30 MED ORDER — ONDANSETRON HCL 4 MG/2ML IJ SOLN
INTRAMUSCULAR | Status: AC
  Filled 2021-12-30: qty 2

## 2021-12-30 MED ORDER — PROPOFOL 10 MG/ML IV BOLUS
INTRAVENOUS | Status: AC
Start: 2021-12-30 — End: ?
  Filled 2021-12-30: qty 40

## 2021-12-30 MED ORDER — AMLODIPINE BESYLATE 5 MG PO TABS
5.0000 mg | ORAL_TABLET | Freq: Every day | ORAL | Status: DC
  Administered 2021-12-31: 5 mg via ORAL
  Filled 2021-12-30: qty 1

## 2021-12-30 MED ORDER — FENTANYL CITRATE (PF) 100 MCG/2ML IJ SOLN
INTRAMUSCULAR | Status: DC | PRN
Start: 2021-12-30 — End: 2021-12-30
  Administered 2021-12-30: 50 ug via INTRAVENOUS

## 2021-12-30 MED ORDER — IODIXANOL 320 MG/ML IV SOLN
INTRAVENOUS | Status: DC | PRN
  Administered 2021-12-30: 35 mL via INTRA_ARTERIAL

## 2021-12-30 MED ORDER — SODIUM CHLORIDE 0.9 % IV SOLN: INTRAVENOUS | Status: DC

## 2021-12-30 MED ORDER — EZETIMIBE 10 MG PO TABS
10.0000 mg | ORAL_TABLET | Freq: Every day | ORAL | Status: DC
  Administered 2021-12-31: 10 mg via ORAL
  Filled 2021-12-30: qty 1

## 2021-12-30 MED ORDER — FAMOTIDINE IN NACL 20-0.9 MG/50ML-% IV SOLN
20.0000 mg | Freq: Two times a day (BID) | INTRAVENOUS | Status: DC
  Administered 2021-12-30: 20 mg via INTRAVENOUS
  Filled 2021-12-30: qty 50

## 2021-12-30 MED ORDER — FAMOTIDINE 20 MG PO TABS: 20.0000 mg | ORAL_TABLET | Freq: Once | ORAL | Status: DC

## 2021-12-30 MED ORDER — SODIUM CHLORIDE 0.9 % IV SOLN: 500.0000 mL | Freq: Once | INTRAVENOUS | Status: DC | PRN

## 2021-12-30 MED ORDER — DROPERIDOL 2.5 MG/ML IJ SOLN
0.6250 mg | Freq: Once | INTRAMUSCULAR | Status: DC | PRN
  Filled 2021-12-30: qty 2

## 2021-12-30 MED ORDER — PROMETHAZINE HCL 25 MG/ML IJ SOLN: 6.2500 mg | INTRAMUSCULAR | Status: DC | PRN

## 2021-12-30 MED ORDER — OXYCODONE-ACETAMINOPHEN 5-325 MG PO TABS: 1.0000 | ORAL_TABLET | ORAL | Status: DC | PRN

## 2021-12-30 MED ORDER — LIDOCAINE HCL (PF) 2 % IJ SOLN
INTRAMUSCULAR | Status: AC
  Filled 2021-12-30: qty 5

## 2021-12-30 MED ORDER — CHLORHEXIDINE GLUCONATE CLOTH 2 % EX PADS
6.0000 | MEDICATED_PAD | Freq: Once | CUTANEOUS | Status: AC
  Administered 2021-12-30: 6 via TOPICAL

## 2021-12-30 MED ORDER — ROCURONIUM BROMIDE 10 MG/ML (PF) SYRINGE
PREFILLED_SYRINGE | INTRAVENOUS | Status: AC
  Filled 2021-12-30: qty 10

## 2021-12-30 MED ORDER — CEFAZOLIN SODIUM-DEXTROSE 2-4 GM/100ML-% IV SOLN
2.0000 g | Freq: Three times a day (TID) | INTRAVENOUS | Status: AC
  Administered 2021-12-30 (×2): 2 g via INTRAVENOUS
  Filled 2021-12-30 (×2): qty 100

## 2021-12-30 MED ORDER — ALUM & MAG HYDROXIDE-SIMETH 200-200-20 MG/5ML PO SUSP: 15.0000 mL | ORAL | Status: DC | PRN

## 2021-12-30 MED ORDER — OXYCODONE HCL 5 MG PO TABS: 5.0000 mg | ORAL_TABLET | Freq: Once | ORAL | Status: DC | PRN

## 2021-12-30 MED ORDER — LIDOCAINE HCL (CARDIAC) PF 100 MG/5ML IV SOSY
PREFILLED_SYRINGE | INTRAVENOUS | Status: DC | PRN
  Administered 2021-12-30: 100 mg via INTRAVENOUS

## 2021-12-30 MED ORDER — CHLORHEXIDINE GLUCONATE 0.12 % MT SOLN
15.0000 mL | Freq: Once | OROMUCOSAL | Status: DC
Start: 2021-12-30 — End: 2021-12-30
  Filled 2021-12-30: qty 15

## 2021-12-30 MED ORDER — HYDROMORPHONE HCL 1 MG/ML IJ SOLN: 1.0000 mg | Freq: Once | INTRAMUSCULAR | Status: DC | PRN

## 2021-12-30 MED ORDER — ACETAMINOPHEN 650 MG RE SUPP: 325.0000 mg | RECTAL | Status: DC | PRN

## 2021-12-30 MED ORDER — ACETAMINOPHEN 325 MG PO TABS
325.0000 mg | ORAL_TABLET | ORAL | Status: DC | PRN
  Administered 2021-12-30: 650 mg via ORAL
  Filled 2021-12-30: qty 2

## 2021-12-30 MED ORDER — PRAVASTATIN SODIUM 20 MG PO TABS
40.0000 mg | ORAL_TABLET | Freq: Every evening | ORAL | Status: DC
  Administered 2021-12-30: 40 mg via ORAL
  Filled 2021-12-30: qty 2

## 2021-12-30 MED ORDER — FENTANYL CITRATE (PF) 100 MCG/2ML IJ SOLN
INTRAMUSCULAR | Status: AC
Start: 2021-12-30 — End: ?
  Filled 2021-12-30: qty 2

## 2021-12-30 MED ORDER — SUGAMMADEX SODIUM 200 MG/2ML IV SOLN
INTRAVENOUS | Status: DC | PRN
  Administered 2021-12-30: 200 mg via INTRAVENOUS

## 2021-12-30 MED ORDER — CHLORHEXIDINE GLUCONATE CLOTH 2 % EX PADS: 6.0000 | MEDICATED_PAD | Freq: Once | CUTANEOUS | Status: DC

## 2021-12-30 MED ORDER — LACTATED RINGERS IV SOLN
INTRAVENOUS | Status: DC
  Administered 2021-12-30: 1000 mL via INTRAVENOUS

## 2021-12-30 MED ORDER — ROCURONIUM BROMIDE 100 MG/10ML IV SOLN
INTRAVENOUS | Status: DC | PRN
Start: 2021-12-30 — End: 2021-12-30
  Administered 2021-12-30: 20 mg via INTRAVENOUS
  Administered 2021-12-30: 60 mg via INTRAVENOUS

## 2021-12-30 MED ORDER — MAGNESIUM SULFATE 2 GM/50ML IV SOLN
2.0000 g | Freq: Every day | INTRAVENOUS | Status: DC | PRN
  Filled 2021-12-30: qty 50

## 2021-12-30 MED ORDER — ORAL CARE MOUTH RINSE: 15.0000 mL | Freq: Once | OROMUCOSAL | Status: DC

## 2021-12-30 MED ORDER — DOCUSATE SODIUM 100 MG PO CAPS
100.0000 mg | ORAL_CAPSULE | Freq: Every day | ORAL | Status: DC
  Administered 2021-12-31: 100 mg via ORAL
  Filled 2021-12-30: qty 1

## 2021-12-30 MED ORDER — NITROGLYCERIN IN D5W 200-5 MCG/ML-% IV SOLN
INTRAVENOUS | Status: AC
  Filled 2021-12-30: qty 250

## 2021-12-30 MED ORDER — ONDANSETRON HCL 4 MG/2ML IJ SOLN: 4.0000 mg | Freq: Four times a day (QID) | INTRAMUSCULAR | Status: DC | PRN

## 2021-12-30 MED ORDER — LABETALOL HCL 5 MG/ML IV SOLN: 10.0000 mg | INTRAVENOUS | Status: DC | PRN

## 2021-12-30 MED ORDER — CEFAZOLIN SODIUM-DEXTROSE 2-4 GM/100ML-% IV SOLN
2.0000 g | INTRAVENOUS | Status: AC
  Administered 2021-12-30: 2 g via INTRAVENOUS

## 2021-12-30 MED ORDER — METOPROLOL TARTRATE 5 MG/5ML IV SOLN: 2.0000 mg | INTRAVENOUS | Status: DC | PRN

## 2021-12-30 MED ORDER — CEFAZOLIN SODIUM-DEXTROSE 2-4 GM/100ML-% IV SOLN
INTRAVENOUS | Status: AC
  Filled 2021-12-30: qty 100

## 2021-12-30 MED ORDER — POTASSIUM CHLORIDE CRYS ER 20 MEQ PO TBCR: 20.0000 meq | EXTENDED_RELEASE_TABLET | Freq: Every day | ORAL | Status: DC | PRN

## 2021-12-30 MED ORDER — ONDANSETRON HCL 4 MG/2ML IJ SOLN
INTRAMUSCULAR | Status: DC | PRN
  Administered 2021-12-30: 4 mg via INTRAVENOUS

## 2021-12-30 MED ORDER — PHENYLEPHRINE HCL-NACL 20-0.9 MG/250ML-% IV SOLN
INTRAVENOUS | Status: DC | PRN
  Administered 2021-12-30: 30 ug/min via INTRAVENOUS

## 2021-12-30 MED ORDER — NITROGLYCERIN IN D5W 200-5 MCG/ML-% IV SOLN: 5.0000 ug/min | INTRAVENOUS | Status: DC

## 2021-12-30 MED ORDER — FENTANYL CITRATE (PF) 100 MCG/2ML IJ SOLN: 25.0000 ug | INTRAMUSCULAR | Status: DC | PRN

## 2021-12-30 MED ORDER — ESMOLOL HCL 100 MG/10ML IV SOLN
INTRAVENOUS | Status: AC
Start: 2021-12-30 — End: ?
  Filled 2021-12-30: qty 10

## 2021-12-30 MED ORDER — HYDRALAZINE HCL 20 MG/ML IJ SOLN: 5.0000 mg | INTRAMUSCULAR | Status: DC | PRN

## 2021-12-30 MED ORDER — DEXAMETHASONE SODIUM PHOSPHATE 10 MG/ML IJ SOLN
INTRAMUSCULAR | Status: DC | PRN
  Administered 2021-12-30: 10 mg via INTRAVENOUS

## 2021-12-30 MED ORDER — ASPIRIN 81 MG PO TBEC
81.0000 mg | DELAYED_RELEASE_TABLET | Freq: Every day | ORAL | Status: DC
  Administered 2021-12-30 – 2021-12-31 (×2): 81 mg via ORAL
  Filled 2021-12-30 (×2): qty 1

## 2021-12-30 MED ORDER — LACTATED RINGERS IV SOLN: INTRAVENOUS | Status: DC | PRN

## 2021-12-30 MED ORDER — PROPOFOL 10 MG/ML IV BOLUS
INTRAVENOUS | Status: DC | PRN
  Administered 2021-12-30: 100 mg via INTRAVENOUS

## 2021-12-30 MED ORDER — HEPARIN SODIUM (PORCINE) 1000 UNIT/ML IJ SOLN
INTRAMUSCULAR | Status: DC | PRN
  Administered 2021-12-30: 6000 [IU] via INTRAVENOUS

## 2021-12-30 MED ORDER — DOPAMINE-DEXTROSE 3.2-5 MG/ML-% IV SOLN: 3.0000 ug/kg/min | INTRAVENOUS | Status: DC

## 2021-12-30 MED ORDER — CHLORHEXIDINE GLUCONATE CLOTH 2 % EX PADS
6.0000 | MEDICATED_PAD | Freq: Every day | CUTANEOUS | Status: DC
  Administered 2021-12-30 – 2021-12-31 (×2): 6 via TOPICAL

## 2021-12-30 SURGICAL SUPPLY — 29 items
CATH ACCU-VU SIZ PIG 5F 70CM (CATHETERS) ×1 IMPLANT
CATH BALLN CODA 9X100X32 (BALLOONS) ×1 IMPLANT
CATH BEACON 5 .035 65 KMP TIP (CATHETERS) ×1 IMPLANT
CLOSURE PERCLOSE PROSTYLE (VASCULAR PRODUCTS) ×4 IMPLANT
COVER DRAPE FLUORO 36X44 (DRAPES) ×2 IMPLANT
COVER PROBE U/S 5X48 (MISCELLANEOUS) ×1 IMPLANT
DEVICE TORQUE .025-.038 (MISCELLANEOUS) ×1 IMPLANT
DRYSEAL FLEXSHEATH 12FR 33CM (SHEATH) ×1
DRYSEAL FLEXSHEATH 18FR 33CM (SHEATH) ×1
EXCLDR TRNK 28.5X14.5X12 16F (Endovascular Graft) ×2 IMPLANT
EXCLUDER TNK 28.5X14.5X12 16F (Endovascular Graft) IMPLANT
GLIDEWIRE STIFF .35X180X3 HYDR (WIRE) ×1 IMPLANT
LEG CONTRALATERAL 16X16X9.5 (Endovascular Graft) ×1 IMPLANT
LEG CONTRALETERAL16X16X11.5 (Endovascular Graft) ×1 IMPLANT
NDL ENTRY 21GA 7CM ECHOTIP (NEEDLE) IMPLANT
NEEDLE ENTRY 21GA 7CM ECHOTIP (NEEDLE) ×2 IMPLANT
PACK ANGIOGRAPHY (CUSTOM PROCEDURE TRAY) ×2 IMPLANT
SET INTRO CAPELLA COAXIAL (SET/KITS/TRAYS/PACK) ×1 IMPLANT
SHEATH BRITE TIP 6FRX11 (SHEATH) ×2 IMPLANT
SHEATH BRITE TIP 8FRX11 (SHEATH) ×2 IMPLANT
SHEATH DRYSEAL FLEX 12FR 33CM (SHEATH) IMPLANT
SHEATH DRYSEAL FLEX 18FR 33CM (SHEATH) IMPLANT
SPONGE XRAY 4X4 16PLY STRL (MISCELLANEOUS) ×3 IMPLANT
STENT GRAFT CONTRALAT 16X11.5 (Endovascular Graft) IMPLANT
SYR MEDRAD MARK 7 150ML (SYRINGE) ×1 IMPLANT
TOWEL OR 17X26 4PK STRL BLUE (TOWEL DISPOSABLE) ×1 IMPLANT
TUBING CONTRAST HIGH PRESS 72 (TUBING) ×1 IMPLANT
WIRE AMPLATZ SSTIFF .035X260CM (WIRE) ×2 IMPLANT
WIRE GUIDERIGHT .035X150 (WIRE) ×2 IMPLANT

## 2021-12-30 NOTE — Op Note (Signed)
? ? ?OPERATIVE NOTE ? ? ?PROCEDURE: ?US guidance for vascular access, bilateral femoral arteries ?Catheter placement into aorta from bilateral femoral approaches ?Placement of a C3 28 x 14 x 12 Gore Excluder Endoprosthesis main body and a 16 x 10 right extender with a 16 x 12 contralateral limb ?ProGlide closure devices bilateral femoral arteries ? ?PRE-OPERATIVE DIAGNOSIS: AAA ? ?POST-OPERATIVE DIAGNOSIS: same ? ?SURGEON: Hortencia Pilar, MD and [name], MD - Co-surgeons ? ?ANESTHESIA: general ? ?ESTIMATED BLOOD LOSS: 25 cc ? ?FINDING(S): ?1.  AAA ? ?SPECIMEN(S):  none ? ?INDICATIONS:   ?Benson Porcaro is a 81 y.o. y.o. male who presents with 5.8 cm infrarenal abdominal aortic aneurysm.  That is suitable for endovascular repair and therefore stent graft is being recommended.  Risk and benefits of been reviewed all questions been answered patient has agreed to proceed. ? ?DESCRIPTION: ?After obtaining full informed written consent, the patient was brought back to the operating room and placed supine upon the operating table.  The patient received IV antibiotics prior to induction.  After obtaining adequate anesthesia, the patient was prepped and draped in the standard fashion for endovascular AAA repair.  Co-surgeons are required because this is a complex bilateral procedure with work being performed simultaneously from both the right femoral and left femoral approach.  This also expedites the procedure making a shorter operative time reducing complications and improving patient safety. ? ?We then began by gaining access to both femoral arteries with US guidance with me working on the patient's right and Dr. Lucky Cowboy working on the patient's left.  The femoral arteries were found to be patent and accessed without difficulty with a needle under ultrasound guidance without difficulty on each side and permanent images were recorded.  We then placed 2 proglide devices on each side in a pre-close fashion and placed 8  French sheaths. ? ?The patient was then given 6000 units of intravenous heparin.  ? ?The Pigtail catheter was placed into the aorta from the right side. Using this image, we selected a 28 x 14 x 12 Main body device.  Over a stiff wire, an 16 French sheath was placed. The main body was then placed through the 16 French sheath. A Kumpe catheter was placed up the left side and a magnified image at the renal arteries was performed. The main body was then deployed just below the lowest renal artery. The Kumpe catheter was used to cannulate the contralateral gate without difficulty and successful cannulation was confirmed by twirling the pigtail catheter in the main body. We then placed a stiff wire and a retrograde arteriogram was performed through the left femoral sheath. We upsized to the 12 [place] sheath for the contralateral limb and a 16 x 12 limb was selected and deployed. The main body deployment was then completed. Based off the angiographic findings, extension limbs were necessary.  A 16 x 10 extender limb was then advanced up the right side deployed without difficulty. All junction points and seals zones were treated with the compliant balloon.  ? ?The pigtail catheter was then replaced and a completion angiogram was performed.   Very faint type II endoleak was detected on completion angiography. The renal arteries were found to be widely patent.   ? ?At this point we elected to terminate the procedure. We secured the pro glide devices for hemostasis on the femoral arteries. The skin incision was closed with a 4-0 Monocryl. Dermabond and pressure dressing were placed. The patient was taken to the recovery room  in stable condition having tolerated the procedure well. ? ?COMPLICATIONS: none ? ?CONDITION: stable ? ?Hortencia Pilar ? ?12/30/2021, 9:08 AM ? ?  ?

## 2021-12-30 NOTE — Interval H&P Note (Signed)
History and Physical Interval Note: ? ?12/30/2021 ?9:37 AM ? ?Dale Becker  has presented today for surgery, with the diagnosis of Endovascular Stent Repair   GORE    ANESTHESIA   AAA.  The various methods of treatment have been discussed with the patient and family. After consideration of risks, benefits and other options for treatment, the patient has consented to  Procedure(s): ?ENDOVASCULAR REPAIR/STENT GRAFT (N/A) as a surgical intervention.  The patient's history has been reviewed, patient examined, no change in status, stable for surgery.  I have reviewed the patient's chart and labs.  Questions were answered to the patient's satisfaction.   ? ? ?Dale Becker ? ? ?

## 2021-12-30 NOTE — Plan of Care (Signed)

## 2021-12-30 NOTE — Transfer of Care (Addendum)
Immediate Anesthesia Transfer of Care Note ? ?Patient: Dale Becker ? ?Procedure(s) Performed: ENDOVASCULAR REPAIR/STENT GRAFT ? ?Patient Location: PACU ? ?Anesthesia Type:General ? ?Level of Consciousness: drowsy and patient cooperative ? ?Airway & Oxygen Therapy: Patient Spontanous Breathing and Patient connected to face mask ? ?Post-op Assessment: Report given to RN and Post -op Vital signs reviewed and stable ? ?Post vital signs: Reviewed and stable ? ?Last Vitals:  ?Vitals Value Taken Time  ?BP 145/91 12/30/21 0930  ?Temp 96.9 ?   ?Pulse 68 12/30/21 0931  ?Resp 16 12/30/21 0931  ?SpO2 100 % 12/30/21 0931  ?Vitals shown include unvalidated device data. ? ?Last Pain:  ?Vitals:  ? 12/30/21 0719  ?TempSrc: Oral  ?PainSc: 0-No pain  ?   ? ?  ? ?Complications: No notable events documented. ?

## 2021-12-30 NOTE — Anesthesia Preprocedure Evaluation (Addendum)
Anesthesia Evaluation  ?Patient identified by MRN, date of birth, ID band ?Patient awake ? ? ? ?Reviewed: ?Allergy & Precautions, NPO status , Patient's Chart, lab work & pertinent test results ? ?Airway ?Mallampati: I ? ?TM Distance: >3 FB ?Neck ROM: full ? ? ? Dental ? ?(+) Edentulous Lower, Partial Upper ?  ?Pulmonary ?shortness of breath and with exertion, Current Smoker and Patient abstained from smoking.,  ?  ?Pulmonary exam normal ? ? ? ? ? ? ? Cardiovascular ?Exercise Tolerance: Poor ?METS (3.5): hypertension, Pt. on medications ?+ CAD, + Past MI and + DOE  ?Normal cardiovascular exam ? ?LHC 01/25/2013: EF 70%; 20% mLAD, 20% oD1, 40% oD2, 60% mLCx, 60% pRCA; 20% mRCA, 20% RPLV; intervention deferred opting for medical mgmt as MI felt to be secondary to plaque rupture rather than obstructive disease. ? ?AAA: 05/2021 Ao Duplex: 4.4cm; d.) 11/17/2021 CTA: 5.8 x 5.1 cm ?  ?Neuro/Psych ?Brain tumor s/p resection ?negative neurological ROS ? negative psych ROS  ? GI/Hepatic ?negative GI ROS, Neg liver ROS,   ?Endo/Other  ?negative endocrine ROS ? Renal/GU ?  ? ?  ?Musculoskeletal ? ?(+) Arthritis ,  ? Abdominal ?Normal abdominal exam  (+)   ?Peds ? Hematology ?negative hematology ROS ?(+)   ?Anesthesia Other Findings ?Past Medical History: ?No date: AAA (abdominal aortic aneurysm) ([place]) ?    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             cm in length; b.) 09/2017 Ao Duplex: 3.4cm; c.) 05/2021 Ao ?             Duplex: 4.4cm; d.) 11/17/2021 CTA: 5.8 x 5.1 cm ?No date: Aortic atherosclerosis ([place]) ?No date: Arthritis ?No date: Brain tumor [facility]) ?    Comment:  a.) s/p resection ?01/2013: Coronary artery disease ?    Comment:  a.) 01/2013 NSTEMI (peak troponin of 22). LHC showed  ?             moderate nonobstructive disease in the LCa and pRCA with  ?             an FFR ratio of 0.88 and 0.90 respectively. Hyperdamic EF ?             of 70%. MI was  likely due to plaque rupture without  ?             obstructive disease ?No date: Dyspnea ?No date: History of echocardiogram ?    Comment:  a. 02/2013 Echo: EF 55-60%, mild TR. ?No date: Hyperlipidemia ?No date: Hypertension ?01/2013: NSTEMI (non-ST elevated myocardial infarction) ([place]) ?    Comment:  a.) LHC 01/25/2013: EF 70%; 20% mLAD, 20% oD1, 40% oD2,  ?             60% mLCx, 60% pRCA; 20% mRCA, 20% RPLV; intervention  ?             deferred opting for medical mgmt as MI felt to be  ?             secondary to plaque rupture rather than obstructive  ?             disease. ?No date: Tobacco use ? ?Past Surgical History: ?No date: BRAIN TUMOR EXCISION ?01/25/2013: CARDIAC CATHETERIZATION ?    Comment:  Procedure: CARDIAC CATHETHERIZATION; Location: [facility];  ?             Surgeon: Rogue Jury  Fletcher Anon, MD ? ? ? ? Reproductive/Obstetrics ?negative OB ROS ? ?  ? ? ? ? ? ? ? ? ? ? ? ? ? ?  ?  ? ? ? ? ? ? ? ?Anesthesia Physical ?Anesthesia Plan ? ?ASA: 3 ? ?Anesthesia Plan: General ETT  ? ?Post-op Pain Management: Ofirmev IV (intra-op)* and Minimal or no pain anticipated  ? ?Induction: Intravenous ? ?PONV Risk Score and Plan: Ondansetron, Dexamethasone and Treatment may vary due to age or medical condition ? ?Airway Management Planned: Oral ETT ? ?Additional Equipment: Arterial line ? ?Intra-op Plan:  ? ?Post-operative Plan: Extubation in OR ? ?Informed Consent: I have reviewed the patients History and Physical, chart, labs and discussed the procedure including the risks, benefits and alternatives for the proposed anesthesia with the patient or authorized representative who has indicated his/her understanding and acceptance.  ? ? ? ?Dental Advisory Given ? ?Plan Discussed with: Anesthesiologist, CRNA and Surgeon ? ?Anesthesia Plan Comments:   ? ? ? ? ? ?Anesthesia Quick Evaluation ? ?

## 2021-12-30 NOTE — Anesthesia Procedure Notes (Signed)
Procedure Name: Intubation ?Date/Time: 12/30/2021 7:53 AM ?Performed by: Terrence Dupont, RN ?Pre-anesthesia Checklist: Patient identified, Emergency Drugs available, Suction available and Patient being monitored ?Patient Re-evaluated:Patient Re-evaluated prior to induction ?Oxygen Delivery Method: Circle system utilized ?Preoxygenation: Pre-oxygenation with 100% oxygen ?Induction Type: IV induction ?Ventilation: Mask ventilation without difficulty ?Laryngoscope Size: Mac and 3 ?Grade View: Grade I ?Tube type: Oral ?Tube size: 7.5 mm ?Number of attempts: 1 ?Airway Equipment and Method: Stylet and Oral airway ?Placement Confirmation: ETT inserted through vocal cords under direct vision, positive ETCO2 and breath sounds checked- equal and bilateral ?Secured at: 22 cm ?Tube secured with: Tape ?Dental Injury: Teeth and Oropharynx as per pre-operative assessment  ? ? ? ? ?

## 2021-12-30 NOTE — Op Note (Signed)
? ? ?OPERATIVE NOTE ? ? ?PROCEDURE: ?US guidance for vascular access, bilateral femoral arteries ?Catheter placement into aorta from bilateral femoral approaches ?Placement of a 28 mm proximal, 12 cm length conformable Gore Excluder Endoprosthesis main body right with a 16 mm diameter by 12 cm length left iliac contralateral limb ?Placement of a 16 mm diameter by 10 cm length right iliac extension limb ?ProGlide closure devices bilateral femoral arteries ? ?PRE-OPERATIVE DIAGNOSIS: AAA ? ?POST-OPERATIVE DIAGNOSIS: same ? ?SURGEON: Dale Pain, MD and Hortencia Pilar, MD - Co-surgeons ? ?ANESTHESIA: General ? ?ESTIMATED BLOOD LOSS: 25 cc ? ?FINDING(S): ?1.  AAA ? ?SPECIMEN(S):  none ? ?INDICATIONS:   ?Dale Becker is a 81 y.o. male who presents with a greater than 5 cm abdominal aortic aneurysm. The anatomy was suitable for endovascular repair.  Risks and benefits of repair in an endovascular fashion were discussed and informed consent was obtained. Co-surgeons are used to expedite the procedure and reduce operative time as bilateral work needs to be done. ? ?DESCRIPTION: ?After obtaining full informed written consent, the patient was brought back to the operating room and placed supine upon the operating table.  The patient received IV antibiotics prior to induction.  After obtaining adequate anesthesia, the patient was prepped and draped in the standard fashion for endovascular AAA repair.  We then began by gaining access to both femoral arteries with US guidance with me working on the left and Dr. Delana Meyer working on the right.  The femoral arteries were found to be patent and accessed without difficulty with a needle under ultrasound guidance without difficulty on each side and permanent images were recorded.  We then placed 2 proglide devices on each side in a pre-close fashion and placed 8 French sheaths. The patient was then given 6000 units of intravenous heparin. The Pigtail catheter was placed into the  aorta from the right side. Using this image, we selected a 28 mm diameter by 12 cm length Main body device.  Over a stiff wire, an 50 French sheath was placed up the right side. The main body was then placed through the 18 French sheath. A Kumpe catheter was placed up the left side and a magnified image at the renal arteries was performed. The main body was then deployed just below the lowest renal artery. The Kumpe catheter was used to cannulate the contralateral gate without difficulty and successful cannulation was confirmed by twirling the pigtail catheter in the main body. We then placed a stiff wire and a retrograde arteriogram was performed through the left femoral sheath. We upsized to the 12 [place] sheath on the left side for the contralateral limb and a 16 mm diameter by 12 cm length left iliac limb was selected and deployed. The main body deployment was then completed. Based off the angiographic findings, extension limbs were necessary.  A 16 mm diameter by 10 cm length right iliac extension limb was deployed to just above the right hypogastric artery. All junction points and seals zones were treated with the compliant balloon. The pigtail catheter was then replaced and a completion angiogram was performed.  A possible small type II endoleak was detected on completion angiography.  No type I or III endoleak's were identified.  The renal arteries were found to be widely patent. At this point we elected to terminate the procedure. We secured the pro glide devices for hemostasis on the femoral arteries. The skin incision was closed with a 4-0 Monocryl. Dermabond and pressure dressing were placed. The  patient was taken to the recovery room in stable condition having tolerated the procedure well. ? ?COMPLICATIONS: none ? ?CONDITION: stable ? ?Dale Becker ? ?12/30/2021, 9:33 AM ? ? ?This note was created with Dragon Medical transcription system. Any errors in dictation are purely unintentional.  ?

## 2021-12-31 LAB — BASIC METABOLIC PANEL
Anion gap: 5 (ref 5–15)
BUN: 15 mg/dL (ref 8–23)
CO2: 22 mmol/L (ref 22–32)
Calcium: 8.9 mg/dL (ref 8.9–10.3)
Chloride: 110 mmol/L (ref 98–111)
Creatinine, Ser: 1.1 mg/dL (ref 0.61–1.24)
GFR, Estimated: >60 mL/min (ref >60)
Glucose, Bld: 102 mg/dL — ABNORMAL HIGH (ref 70–99)
Potassium: 3.7 mmol/L (ref 3.5–5.1)
Sodium: 137 mmol/L (ref 135–145)

## 2021-12-31 LAB — CBC
HCT: 36.9 % — ABNORMAL LOW (ref 39.0–52.0)
Hemoglobin: 12.8 g/dL — ABNORMAL LOW (ref 13.0–17.0)
MCH: 30.1 pg (ref 26.0–34.0)
MCHC: 34.7 g/dL (ref 30.0–36.0)
MCV: 86.8 fL (ref 80.0–100.0)
Platelets: 159 10*3/uL (ref 150–400)
RBC: 4.25 MIL/uL (ref 4.22–5.81)
RDW: 13.5 % (ref 11.5–15.5)
WBC: 10.6 10*3/uL — ABNORMAL HIGH (ref 4.0–10.5)
nRBC: 0 % (ref 0.0–0.2)

## 2021-12-31 MED ORDER — CLOPIDOGREL BISULFATE 75 MG PO TABS: 75.0000 mg | ORAL_TABLET | Freq: Every day | ORAL | 6 refills | Status: DC

## 2021-12-31 MED ORDER — HYDROCODONE-ACETAMINOPHEN 5-325 MG PO TABS: 1.0000 | ORAL_TABLET | Freq: Four times a day (QID) | ORAL | 0 refills | Status: DC | PRN

## 2021-12-31 MED ORDER — FAMOTIDINE 20 MG PO TABS
20.0000 mg | ORAL_TABLET | Freq: Two times a day (BID) | ORAL | Status: DC
  Administered 2021-12-31: 20 mg via ORAL
  Filled 2021-12-31: qty 1

## 2021-12-31 NOTE — Progress Notes (Signed)
PT Aox4, Vitals WNL, All questions addressed and answered. Pt eagerly waiting for ride, will continue to monitor until discharging pt completely.

## 2021-12-31 NOTE — Plan of Care (Signed)

## 2021-12-31 NOTE — Anesthesia Postprocedure Evaluation (Signed)
Anesthesia Post Note  Patient: Dale Becker  Procedure(s) Performed: ENDOVASCULAR REPAIR/STENT GRAFT  Patient location during evaluation: ICU Anesthesia Type: General Level of consciousness: awake and alert Pain management: pain level controlled Vital Signs Assessment: post-procedure vital signs reviewed and stable Respiratory status: spontaneous breathing, nonlabored ventilation, respiratory function stable and patient connected to nasal cannula oxygen Cardiovascular status: blood pressure returned to baseline and stable Postop Assessment: no apparent nausea or vomiting Anesthetic complications: no   No notable events documented.   Last Vitals:  Vitals:   12/31/21 0700 12/31/21 0800  BP: 114/63 124/74  Pulse: 64 (!) 55  Resp: 12 18  Temp: 36.6 C 36.6 C  SpO2: 99% 99%    Last Pain:  Vitals:   12/31/21 0800  TempSrc: Oral  PainSc: 0-No pain                 Estill Batten

## 2021-12-31 NOTE — Discharge Summary (Signed)
[facility]    Discharge Summary    Patient ID:  Dale Becker MRN: [id number] DOB/AGE: 1941/08/01 81 y.o.  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 LHC showed moderate nonobstructive disease in the LCa and pRCA with an FFR ratio of 0.88 and 0.90 respectively. Hyperdamic EF of 70%. MI was likely due to plaque rupture without obstructive disease   Dyspnea    History of echocardiogram    a. 02/2013 Echo: EF 55-60%, mild TR.   Hyperlipidemia    Hypertension    NSTEMI (non-ST elevated myocardial infarction) ([place]) 01/2013   a.) LHC 01/25/2013: EF 70%; 20% mLAD, 20% oD1, 40% oD2, 60% mLCx, 60% pRCA; 20% mRCA, 20% RPLV; intervention deferred opting for medical mgmt as MI felt to be secondary to plaque rupture rather than obstructive disease.   Tobacco use     Procedure(s): ENDOVASCULAR REPAIR/STENT GRAFT  Discharged Condition: good  HPI:  Dale Becker is a 81 year old male that presented on 12/30/2021 for endovascular repair of abdominal aortic aneurysm.  The patient denies significant complaints post intervention.  He has ambulated without difficulty and complains of no groin or leg pain.  He denies abdominal pain.   Hospital Course:  Dale Becker is a 81  y.o. male is S/P Abdominal Aortic Aneurysm Repair  Procedure(s): ENDOVASCULAR REPAIR/STENT GRAFT Extubated: POD # 0 Physical exam: Ambulating well, groins, soft and intact  Post-op wounds clean, dry, intact or healing well Pt. Ambulating, voiding and taking PO diet without difficulty. Pt pain controlled with PO pain meds. Labs as below Complications:none  Consults:    Significant Diagnostic Studies: CBC Lab Results  Component Value Date   WBC 10.6 (H) 12/31/2021   HGB 12.8 (L) 12/31/2021   HCT 36.9 (L) 12/31/2021   MCV 86.8 12/31/2021   PLT 159 12/31/2021    BMET    Component Value Date/Time   NA 137 12/31/2021 0336   NA 141 05/21/2021 0946   NA 131 (L) 07/20/2013 1301   K 3.7 12/31/2021 0336   K 3.5 07/20/2013 1301   CL 110 12/31/2021 0336   CL 97 (L) 07/20/2013 1301   CO2 22 12/31/2021 0336   CO2 32 07/20/2013 1301   GLUCOSE 102 (H) 12/31/2021 0336   GLUCOSE 124 (H) 07/20/2013 1301   BUN 15 12/31/2021 0336   BUN 9 05/21/2021 0946   BUN 12 07/20/2013 1301   CREATININE 1.10 12/31/2021 0336   CREATININE 1.22 07/20/2013 1301   CALCIUM 8.9 12/31/2021 0336   CALCIUM 8.8 07/20/2013 1301   GFRNONAA >60 12/31/2021 0336   GFRNONAA 60 (L) 07/20/2013 1301   GFRAA 69 03/18/2020 0957   GFRAA >60 07/20/2013 1301   COAG Lab Results  Component Value Date   INR 1.2 12/30/2021  INR 1.1 01/25/2013     Disposition:  Discharge to :Home  Allergies as of 12/31/2021       Reactions   Atorvastatin    Myalgias   Lisinopril Other (See Comments), Hives   Angioedema. Angioedema.   Rosuvastatin    Myalgias   Simvastatin    Myalgias        Medication List     TAKE these medications    amLODipine 5 MG tablet Commonly known as: NORVASC Take 5 mg by mouth daily.   aspirin 81 MG tablet Take 81 mg by mouth daily.   clopidogrel 75 MG tablet Commonly known as: Plavix Take 1 tablet (75 mg total) by mouth daily.   ezetimibe 10 MG tablet Commonly known as:  ZETIA Take 10 mg by mouth daily.   HYDROcodone-acetaminophen 5-325 MG tablet Commonly known as: NORCO/VICODIN Take 1 tablet by mouth every 6 (six) hours as needed for moderate pain.   ibuprofen 200 MG tablet Commonly known as: ADVIL Take 200 mg by mouth every 6 (six) hours as needed.   Nitrostat 0.4 MG SL tablet Generic drug: nitroGLYCERIN Place 0.4 mg under the tongue every 5 (five) minutes as needed for chest pain.   pravastatin 40 MG tablet Commonly known as: PRAVACHOL TAKE 1 TABLET BY MOUTH ONCE DAILY IN THE EVENING       Verbal and written Discharge instructions given to the patient. Wound care per Discharge AVS  Follow-up Information     Kris Hartmann, NP Follow up in 4 week(s).   Specialty: Vascular Surgery Why: See FB or GS with [facility]AR in 4 weeks Contact information: [place] [address] [phone]                 Signed: Kris Hartmann, NP  12/31/2021, 3:14 PM

## 2021-12-31 NOTE — Progress Notes (Signed)
PHARMACIST - PHYSICIAN COMMUNICATION  CONCERNING: IV to Oral Route Change Policy  RECOMMENDATION: This patient is receiving famotidine by the intravenous route.  Based on criteria approved by the Pharmacy and Therapeutics Committee, the intravenous medication(s) is/are being converted to the equivalent oral dose form(s).   DESCRIPTION: These criteria include: The patient is eating (either orally or via tube) and/or has been taking other orally administered medications for a least 24 hours The patient has no evidence of active gastrointestinal bleeding or impaired GI absorption (gastrectomy, short bowel, patient on TNA or NPO).  If you have questions about this conversion, please contact the [place] 12/31/2021 7:59 AM

## 2022-01-03 ENCOUNTER — Encounter: Payer: Self-pay | Admitting: Cardiovascular Disease

## 2022-01-04 ENCOUNTER — Other Ambulatory Visit (INDEPENDENT_AMBULATORY_CARE_PROVIDER_SITE_OTHER): Payer: Self-pay | Admitting: Nurse Practitioner

## 2022-01-04 ENCOUNTER — Telehealth (INDEPENDENT_AMBULATORY_CARE_PROVIDER_SITE_OTHER): Payer: Self-pay

## 2022-01-04 MED ORDER — BACLOFEN 10 MG PO TABS: 10.0000 mg | ORAL_TABLET | Freq: Three times a day (TID) | ORAL | 0 refills | Status: DC

## 2022-01-04 MED ORDER — SIMETHICONE 125 MG PO CAPS: 1.0000 | ORAL_CAPSULE | Freq: Four times a day (QID) | ORAL | 0 refills | Status: DC | PRN

## 2022-01-04 NOTE — Telephone Encounter (Signed)
Patient had a AAA on 12/30/21 with Dr. Delana Meyer. Patient called stating he has had the hiccups since his surgery and wants to know what to do about them. Please advise. Thank you

## 2022-01-04 NOTE — Telephone Encounter (Signed)
It isn't normal that the hiccups occur postprocedure however we will try some baclofen (which I called into the pharmacy) and simethicone (which she can get over-the-counter as Gas-X) to see if this helps to resolve some of your symptoms.

## 2022-01-04 NOTE — Telephone Encounter (Signed)
Spoke with the patient and a\gave the recommendations from Eulogio Ditch NP. See notes  below.

## 2022-01-21 ENCOUNTER — Other Ambulatory Visit (INDEPENDENT_AMBULATORY_CARE_PROVIDER_SITE_OTHER): Payer: Self-pay | Admitting: Vascular Surgery

## 2022-01-21 DIAGNOSIS — I714 Abdominal aortic aneurysm, without rupture, unspecified: Secondary | ICD-10-CM

## 2022-01-21 DIAGNOSIS — Z8679 Personal history of other diseases of the circulatory system: Secondary | ICD-10-CM

## 2022-02-03 ENCOUNTER — Ambulatory Visit (INDEPENDENT_AMBULATORY_CARE_PROVIDER_SITE_OTHER): Payer: 59 | Admitting: Nurse Practitioner

## 2022-02-03 ENCOUNTER — Ambulatory Visit (INDEPENDENT_AMBULATORY_CARE_PROVIDER_SITE_OTHER): Payer: 59

## 2022-02-03 ENCOUNTER — Encounter (INDEPENDENT_AMBULATORY_CARE_PROVIDER_SITE_OTHER): Payer: Self-pay | Admitting: Nurse Practitioner

## 2022-02-03 VITALS — BP 131/76 | HR 80 | Resp 18 | Ht 72.0 in | Wt 153.0 lb

## 2022-02-03 DIAGNOSIS — Z720 Tobacco use: Secondary | ICD-10-CM

## 2022-02-03 DIAGNOSIS — I10 Essential (primary) hypertension: Secondary | ICD-10-CM

## 2022-02-03 DIAGNOSIS — I714 Abdominal aortic aneurysm, without rupture, unspecified: Secondary | ICD-10-CM | POA: Diagnosis not present

## 2022-02-03 DIAGNOSIS — Z9889 Other specified postprocedural states: Secondary | ICD-10-CM

## 2022-02-03 DIAGNOSIS — Z8679 Personal history of other diseases of the circulatory system: Secondary | ICD-10-CM

## 2022-02-03 DIAGNOSIS — I7143 Infrarenal abdominal aortic aneurysm, without rupture: Secondary | ICD-10-CM

## 2022-02-03 DIAGNOSIS — E785 Hyperlipidemia, unspecified: Secondary | ICD-10-CM

## 2022-02-15 ENCOUNTER — Encounter (INDEPENDENT_AMBULATORY_CARE_PROVIDER_SITE_OTHER): Payer: Self-pay | Admitting: Nurse Practitioner

## 2022-02-15 NOTE — Progress Notes (Signed)
Subjective:    Patient ID: Dale Becker, male    DOB: 03-29-1941, 81 y.o.   MRN: [id number] No chief complaint on file.   The patient returns to the office for surveillance of an abdominal aortic aneurysm status post stent graft placement on 12/30/2021.   Patient denies abdominal pain or back pain, no other abdominal complaints. No groin related complaints. No symptoms consistent with distal embolization No changes in claudication distance or new rest pain symptoms.  Patient incidentally did have issues with persistent hiccups but this is resolved.  There have been no interval changes in his overall healthcare since his last visit.   Patient denies amaurosis fugax or TIA symptoms.  The patient denies recent episodes of angina or shortness of breath.   Duplex US of the aorta and iliac arteries shows a 4.95 AAA sac with no endoleak, no change in the sac compared to the previous study.     Review of Systems  Skin:  Negative for wound.  All other systems reviewed and are negative.      Objective:   Physical Exam Vitals reviewed.  HENT:     Head: Normocephalic.  Cardiovascular:     Rate and Rhythm: Normal rate.     Pulses:          Dorsalis pedis pulses are 1+ on the right side and 1+ on the left side.  Pulmonary:     Effort: Pulmonary effort is normal.  Skin:    General: Skin is warm and dry.  Neurological:     Mental Status: He is alert and oriented to person, place, and time.  Psychiatric:        Mood and Affect: Mood normal.        Behavior: Behavior normal.        Thought Content: Thought content normal.        Judgment: Judgment normal.     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 LHC showed moderate nonobstructive disease in the LCa and pRCA with an FFR ratio of 0.88 and 0.90 respectively. Hyperdamic EF of 70%. MI was likely due to plaque rupture without obstructive disease   Dyspnea    History of echocardiogram    a. 02/2013 Echo: EF 55-60%, mild TR.   Hyperlipidemia    Hypertension    NSTEMI (non-ST elevated myocardial infarction) ([place]) 01/2013   a.) LHC 01/25/2013: EF 70%; 20% mLAD, 20% oD1, 40% oD2, 60% mLCx, 60% pRCA; 20% mRCA, 20% RPLV; intervention deferred opting for medical mgmt as MI felt to be secondary to plaque rupture rather than obstructive disease.   Tobacco use     Social History   Socioeconomic History   Marital status: Married    Spouse name: Mable   Number of children: Not on file   Years of education: Not on file  Highest education level: Not on file  Occupational History   Not on file  Tobacco Use   Smoking status: Every Day    Packs/day: 1.00    Years: 50.00    Total pack years: 50.00    Types: Cigarettes   Smokeless tobacco: Never  Vaping Use   Vaping Use: Never used  Substance and Sexual Activity   Alcohol use: Yes    Alcohol/week: 1.0 standard drink of alcohol    Types: 1 Cans of beer per week    Comment: 1 beer week   Drug use: No   Sexual activity: Not on file  Other Topics Concern   Not on file  Social History Narrative   Not on file   Social Determinants of Health   Financial Resource Strain: Not on file  Food Insecurity: Not on file  Transportation Needs: Not on file  Physical Activity: Not on file  Stress: Not on file  Social Connections: Not on file  Intimate Partner Violence: Not on file    Past Surgical History:  Procedure Laterality Date   BRAIN TUMOR EXCISION     CARDIAC CATHETERIZATION   01/25/2013   Procedure: CARDIAC CATHETHERIZATION; Location: [facility]; Surgeon: Kathlyn Sacramento, MD   ENDOVASCULAR REPAIR/STENT GRAFT N/A 12/30/2021   Procedure: ENDOVASCULAR REPAIR/STENT GRAFT;  Surgeon: Katha Cabal, MD;  Location: [facility];  Service: Cardiovascular;  Laterality: N/A;    Family History  Family history unknown: Yes    Allergies  Allergen Reactions   Atorvastatin     Myalgias    Lisinopril Other (See Comments) and Hives    Angioedema. Angioedema.   Rosuvastatin     Myalgias    Simvastatin     Myalgias        Latest Ref Rng & Units 12/31/2021    3:36 AM 12/30/2021   10:20 AM 12/23/2021    9:42 AM  CBC  WBC 4.0 - 10.5 K/uL 10.6  5.2  3.2   Hemoglobin 13.0 - 17.0 g/dL 12.8  14.6  13.4   Hematocrit 39.0 - 52.0 % 36.9  44.1  40.3   Platelets 150 - 400 K/uL 159  171  180       CMP     Component Value Date/Time   NA 137 12/31/2021 0336   NA 141 05/21/2021 0946   NA 131 (L) 07/20/2013 1301   K 3.7 12/31/2021 0336   K 3.5 07/20/2013 1301   CL 110 12/31/2021 0336   CL 97 (L) 07/20/2013 1301   CO2 22 12/31/2021 0336   CO2 32 07/20/2013 1301   GLUCOSE 102 (H) 12/31/2021 0336   GLUCOSE 124 (H) 07/20/2013 1301   BUN 15 12/31/2021 0336   BUN 9 05/21/2021 0946   BUN 12 07/20/2013 1301   CREATININE 1.10 12/31/2021 0336   CREATININE 1.22 07/20/2013 1301   CALCIUM 8.9 12/31/2021 0336   CALCIUM 8.8 07/20/2013 1301   PROT 6.9 07/27/2021 0819   PROT 8.3 (H) 07/20/2013 1301   ALBUMIN 4.1 07/27/2021 0819   ALBUMIN 3.7 07/20/2013 1301   AST 15 07/27/2021 0819   AST 59 (H) 07/20/2013 1301   ALT 14 07/27/2021 0819   ALT 29 07/20/2013 1301   ALKPHOS 86 07/27/2021 0819   ALKPHOS 63 07/20/2013 1301   BILITOT 0.5 07/27/2021 0819   BILITOT 0.6 07/20/2013 1301   GFRNONAA >60 12/31/2021 0336   GFRNONAA 60 (L) 07/20/2013 1301   GFRAA 69 03/18/2020 0957   GFRAA >60 07/20/2013 1301  No results found.     Assessment & Plan:   1. Infrarenal  abdominal aortic aneurysm (AAA) without rupture (HCC) Recommend:  Patient is status post successful endovascular repair of the AAA.   No further intervention is required at this time.   No endoleak is detected and the aneurysm sac is stable.  The patient will continue antiplatelet therapy as prescribed as well as aggressive management of hyperlipidemia. Exercise is again strongly encouraged.   However, endografts require continued surveillance with ultrasound or CT scan. This is mandatory to detect any changes that allow repressurization of the aneurysm sac.  The patient is informed that this would be asymptomatic.  The patient is reminded that lifelong routine surveillance is a necessity with an endograft. Patient will continue to follow-up at the specified interval with ultrasound of the aorta.   2. Hyperlipidemia, unspecified hyperlipidemia type Continue statin as ordered and reviewed, no changes at this time   3. Tobacco use Smoking cessation was discussed, 3-10 minutes spent on this topic specifically   4. Primary hypertension Continue antihypertensive medications as already ordered, these medications have been reviewed and there are no changes at this time.    Current Outpatient Medications on File Prior to Visit  Medication Sig Dispense Refill   amLODipine (NORVASC) 5 MG tablet Take 5 mg by mouth daily.     aspirin 81 MG tablet Take 81 mg by mouth daily.     baclofen (LIORESAL) 10 MG tablet Take 1 tablet (10 mg total) by mouth 3 (three) times daily. 30 each 0   clopidogrel (PLAVIX) 75 MG tablet Take 1 tablet (75 mg total) by mouth daily. 30 tablet 6   ezetimibe (ZETIA) 10 MG tablet Take 10 mg by mouth daily.     HYDROcodone-acetaminophen (NORCO/VICODIN) 5-325 MG tablet Take 1 tablet by mouth every 6 (six) hours as needed for moderate pain. 20 tablet 0   ibuprofen (ADVIL) 200 MG tablet Take 200 mg by mouth every 6 (six) hours as needed.     NITROSTAT 0.4 MG SL tablet Place 0.4  mg under the tongue every 5 (five) minutes as needed for chest pain.     pravastatin (PRAVACHOL) 40 MG tablet TAKE 1 TABLET BY MOUTH ONCE DAILY IN THE EVENING 30 tablet 9   Simethicone 125 MG CAPS Take 1 capsule (125 mg total) by mouth every 6 (six) hours as needed. 28 capsule 0   No current facility-administered medications on file prior to visit.    There are no Patient Instructions on file for this visit. No follow-ups on file.   Kris Hartmann, NP

## 2022-03-16 ENCOUNTER — Encounter: Payer: Self-pay | Admitting: Cardiovascular Disease

## 2022-03-16 ENCOUNTER — Ambulatory Visit (INDEPENDENT_AMBULATORY_CARE_PROVIDER_SITE_OTHER): Payer: 59 | Admitting: Cardiovascular Disease

## 2022-03-16 VITALS — BP 140/70 | HR 72 | Ht 66.0 in | Wt 153.0 lb

## 2022-03-16 DIAGNOSIS — Z720 Tobacco use: Secondary | ICD-10-CM

## 2022-03-16 DIAGNOSIS — I10 Essential (primary) hypertension: Secondary | ICD-10-CM

## 2022-03-16 DIAGNOSIS — E785 Hyperlipidemia, unspecified: Secondary | ICD-10-CM

## 2022-03-16 DIAGNOSIS — I714 Abdominal aortic aneurysm, without rupture, unspecified: Secondary | ICD-10-CM

## 2022-03-16 DIAGNOSIS — I2510 Atherosclerotic heart disease of native coronary artery without angina pectoris: Secondary | ICD-10-CM

## 2022-03-16 NOTE — Progress Notes (Signed)
Cardiology Office Note   Date:  03/16/2022   ID:  Dale Becker, DOB [birth year], MRN [id number]  PCP:  Dale Pitch, MD  Cardiologist:   Dale Sacramento, MD   Chief Complaint  Patient presents with   3 month follow up     S/p AAA  & is "Doing well." Medications reviewed by the patient verbally.       History of Present Illness: Dale Becker is a 81 y.o. male who presents for  a followup visit regarding coronary artery disease.  He has chronic medical conditions including essential hypertension, hyperlipidemia, tobacco use, abdominal aortic aneurysm status post endovascular repair and brain tumor status post resection.  He Had non-ST elevation myocardial infarction in June 2014. Echocardiogram showed normal LV and RV systolic function without significant valvular abnormalities. He underwent  cardiac catheterization which showed moderate disease in the mid left circumflex and proximal RCA. Pressure wire interrogation was performed on both and was not obstructive. The left circumflex appeared slightly hazy and possibly the site of a plaque rupture .  He was treated medically.  He did not tolerate atorvastatin, simvastatin or rosuvastatin due to myalgia.  He is known to have abdominal aortic aneurysm that progressed in size to 5.8 in April.  Thus, he was referred to vascular surgery.  He underwent a Lexiscan Myoview in May which showed no evidence of ischemia.  He underwent endovascular repair by Dr. Delana Meyer in May.  He has been doing well with no recent chest pain or worsening dyspnea.  He continues to smoke 1 pack/day.  He takes his medications regularly.   Past Medical History:  Diagnosis Date   AAA (abdominal aortic aneurysm) ([place])    a.) 09/2016 Ao Duplex: 3.2 x 3.4 cm juxtarenal AAA - 3.1 cm in length; b.) 09/2017 Ao Duplex: 3.4cm; c.) 05/2021 Ao Duplex: 4.4cm; d.) 11/17/2021 CTA: 5.8 x 5.1 cm   Aortic atherosclerosis (HCC)    Arthritis    Brain tumor ([place])    a.) s/p  resection   Coronary artery disease 01/2013   a.) 01/2013 NSTEMI (peak troponin of 22). LHC showed moderate nonobstructive disease in the LCa and pRCA with an FFR ratio of 0.88 and 0.90 respectively. Hyperdamic EF of 70%. MI was likely due to plaque rupture without obstructive disease   Dyspnea    History of echocardiogram    a. 02/2013 Echo: EF 55-60%, mild TR.   Hyperlipidemia    Hypertension    NSTEMI (non-ST elevated myocardial infarction) ([place]) 01/2013   a.) LHC 01/25/2013: EF 70%; 20% mLAD, 20% oD1, 40% oD2, 60% mLCx, 60% pRCA; 20% mRCA, 20% RPLV; intervention deferred opting for medical mgmt as MI felt to be secondary to plaque rupture rather than obstructive disease.   Tobacco use     Past Surgical History:  Procedure Laterality Date   BRAIN TUMOR EXCISION     CARDIAC CATHETERIZATION  01/25/2013   Procedure: CARDIAC CATHETHERIZATION; Location: [facility]; Surgeon: Dale Sacramento, MD   ENDOVASCULAR REPAIR/STENT GRAFT N/A 12/30/2021   Procedure: ENDOVASCULAR REPAIR/STENT GRAFT;  Surgeon: Dale Cabal, MD;  Location: [facility];  Service: Cardiovascular;  Laterality: N/A;     Current Outpatient Medications  Medication Sig Dispense Refill   amLODipine (NORVASC) 5 MG tablet Take 5 mg by mouth daily.     aspirin 81 MG tablet Take 81 mg by mouth daily.     baclofen (LIORESAL) 10 MG tablet Take 1 tablet (10 mg total) by mouth 3 (three) times  daily. 30 each 0   clopidogrel (PLAVIX) 75 MG tablet Take 1 tablet (75 mg total) by mouth daily. 30 tablet 6   ezetimibe (ZETIA) 10 MG tablet Take 10 mg by mouth daily.     HYDROcodone-acetaminophen (NORCO/VICODIN) 5-325 MG tablet Take 1 tablet by mouth every 6 (six) hours as needed for moderate pain. 20 tablet 0   ibuprofen (ADVIL) 200 MG tablet Take 200 mg by mouth every 6 (six) hours as needed.     NITROSTAT 0.4 MG SL tablet Place 0.4 mg under the tongue every 5 (five) minutes as needed for chest pain.     pravastatin (PRAVACHOL) 40 MG  tablet TAKE 1 TABLET BY MOUTH ONCE DAILY IN THE EVENING 30 tablet 9   Simethicone 125 MG CAPS Take 1 capsule (125 mg total) by mouth every 6 (six) hours as needed. 28 capsule 0   No current facility-administered medications for this visit.    Allergies:   Atorvastatin, Lisinopril, Rosuvastatin, and Simvastatin    Social History:  The patient  reports that he has been smoking cigarettes. He has a 50.00 pack-year smoking history. He has never used smokeless tobacco. He reports current alcohol use of about 1.0 standard drink of alcohol per week. He reports that he does not use drugs.   Family History:  The patient's Family history is unknown by patient.    ROS:  Please see the history of present illness.   Otherwise, review of systems are positive for none.   All other systems are reviewed and negative.    PHYSICAL EXAM: VS:  BP (!) 140/70 (BP Location: Left Arm, Patient Position: Sitting, Cuff Size: Normal)   Pulse 72   Ht '5\' 6"'$  (1.676 m)   Wt 153 lb (69.4 kg)   SpO2 98%   BMI 24.69 kg/m  , BMI Body mass index is 24.69 kg/m. GEN: Well nourished, well developed, in no acute distress  HEENT: normal  Neck: no JVD, carotid bruits, or masses Cardiac: RRR; no murmurs, rubs, or gallops,no edema  Respiratory:  clear to auscultation bilaterally, normal work of breathing GI: soft, nontender, nondistended, + BS MS: no deformity or atrophy  Skin: warm and dry, no rash Neuro:  Strength and sensation are intact Psych: euthymic mood, full affect   EKG:  EKG is ordered today. The ekg ordered today demonstrates normal sinus rhythm with right bundle branch block.   Recent Labs: 07/27/2021: ALT 14 12/30/2021: Magnesium 2.1 12/31/2021: BUN 15; Creatinine, Ser 1.10; Hemoglobin 12.8; Platelets 159; Potassium 3.7; Sodium 137    Lipid Panel    Component Value Date/Time   CHOL 121 07/27/2021 0819   CHOL 162 01/26/2013 0402   TRIG 72 07/27/2021 0819   TRIG 76 01/26/2013 0402   HDL 32 (L)  07/27/2021 0819   HDL 39 (L) 01/26/2013 0402   CHOLHDL 3.8 07/27/2021 0819   CHOLHDL 4.7 07/09/2020 0832   VLDL 22 07/09/2020 0832   VLDL 15 01/26/2013 0402   LDLCALC 74 07/27/2021 0819   LDLCALC 108 (H) 01/26/2013 0402      Wt Readings from Last 3 Encounters:  03/16/22 153 lb (69.4 kg)  02/03/22 153 lb (69.4 kg)  12/30/21 149 lb 7.6 oz (67.8 kg)           No data to display            ASSESSMENT AND PLAN:  1.  Coronary artery disease involving native coronary artery without angina: Lexiscan Myoview in May showed no evidence  of ischemia with normal ejection fraction.  Continue medical therapy.  2.  Abdominal aortic aneurysm status post endovascular repair: Followed by vascular surgery.  3.  Tobacco use: I again had a prolonged discussion with him about the importance of smoking cessation.  4.  Hyperlipidemia: Intolerance to potent statins but is tolerating pravastatin and Zetia.  Most recent lipid profile showed an LDL of 74.  5.  Essential hypertension: Blood pressure is reasonably controlled on amlodipine.    Disposition:   FU in 6 months  Signed,  Dale Sacramento, MD  03/16/2022 4:19 PM    [facility]

## 2022-03-16 NOTE — Patient Instructions (Signed)
Medication Instructions:  Your physician recommends that you continue on your current medications as directed. Please refer to the Current Medication list given to you today.  *If you need a refill on your cardiac medications before your next appointment, please call your pharmacy*   Lab Work: None ordered If you have labs (blood work) drawn today and your tests are completely normal, you will receive your results only by: [place] (if you have MyChart) OR A paper copy in the mail If you have any lab test that is abnormal or we need to change your treatment, we will call you to review the results.   Testing/Procedures: None ordered   Follow-Up: At [facility], you and your health needs are our priority.  As part of our continuing mission to provide you with exceptional heart care, we have created designated Provider Care Teams.  These Care Teams include your primary Cardiologist (physician) and Advanced Practice Providers (APPs -  Physician Assistants and Nurse Practitioners) who all work together to provide you with the care you need, when you need it.  We recommend signing up for the patient portal called "MyChart".  Sign up information is provided on this After Visit Summary.  MyChart is used to connect with patients for Virtual Visits (Telemedicine).  Patients are able to view lab/test results, encounter notes, upcoming appointments, etc.  Non-urgent messages can be sent to your provider as well.   To learn more about what you can do with MyChart, go to [email or web address].    Your next appointment:   Your physician wants you to follow-up in: 6 months You will receive a reminder letter in the mail two months in advance. If you don't receive a letter, please call our office to schedule the follow-up appointment.   The format for your next appointment:   In Person  Provider:   You may see Kathlyn Sacramento, MD or one of the following Advanced Practice Providers on your  designated Care Team:   Murray Hodgkins, NP Christell Faith, PA-C Cadence Kathlen Mody, [place]   Other Instructions  Steps to Quit Smoking Smoking tobacco is the leading cause of preventable death. It can affect almost every organ in the body. Smoking puts you and those around you at risk for developing many serious chronic diseases. Quitting smoking can be very challenging. Do not get discouraged if you are not successful the first time. Some people need to make many attempts to quit before they achieve long-term success. Do your best to stick to your quit plan, and talk with your health care provider if you have any questions or concerns. How do I get ready to quit? When you decide to quit smoking, create a plan to help you succeed. Before you quit: Pick a date to quit. Set a date within the next 2 weeks to give you time to prepare. Write down the reasons why you are quitting. Keep this list in places where you will see it often. Tell your family, friends, and co-workers that you are quitting. Support from people you are close to can make quitting easier. Talk with your health care provider about your options for quitting smoking. Find out what treatment options are covered by your health insurance. Identify people, places, things, and activities that make you want to smoke (triggers). Avoid them. What first steps can I take to quit smoking? Throw away all cigarettes at home, at work, and in your car. Throw away smoking accessories, such as Scientist, research (medical). Clean your car.  Make sure to empty the ashtray. Clean your home, including curtains and carpets. What strategies can I use to quit smoking? Talk with your health care provider about combining strategies, such as taking medicines while you are also receiving in-person counseling. Using these two strategies together makes you more likely to succeed in quitting than if you used either strategy on its own. If you are pregnant or breastfeeding,  talk with your health care provider about finding counseling or other support strategies to quit smoking. Do not take medicine to help you quit smoking unless your health care provider tells you to. Quit right away Quit smoking completely, instead of gradually reducing how much you smoke over a period of time. Stopping smoking right away may be more successful than gradually quitting. Attend in-person counseling to help you build problem-solving skills. You are more likely to succeed in quitting if you attend counseling sessions regularly. Even short sessions of 10 minutes can be effective. Take medicine You may take medicines to help you quit smoking. Some medicines require a prescription. You can also purchase over-the-counter medicines. Medicines may have nicotine in them to replace the nicotine in cigarettes. Medicines may: Help to stop cravings. Help to relieve withdrawal symptoms. Your health care provider may recommend: Nicotine patches, gum, or lozenges. Nicotine inhalers or sprays. Non-nicotine medicine that you take by mouth. Find resources Find resources and support systems that can help you quit smoking and remain smoke-free after you quit. These resources are most helpful when you use them often. They include: Online chats with a Social worker. Telephone quitlines. Printed Furniture conservator/restorer. Support groups or group counseling. Text messaging programs. Mobile phone apps or applications. Use apps that can help you stick to your quit plan by providing reminders, tips, and encouragement. Examples of free services include Quit Guide from the CDC and smokefree.gov  What can I do to make it easier to quit?  Reach out to your family and friends for support and encouragement. Call telephone quitlines, such as 1-800-QUIT-NOW, reach out to support groups, or work with a counselor for support. Ask people who smoke to avoid smoking around you. Avoid places that trigger you to smoke, such as  bars, parties, or smoke-break areas at work. Spend time with people who do not smoke. Lessen the stress in your life. Stress can be a smoking trigger for some people. To lessen stress, try: Exercising regularly. Doing deep-breathing exercises. Doing yoga. Meditating. What benefits will I see if I quit smoking? Over time, you should start to see positive results, such as: Improved sense of smell and taste. Decreased coughing and sore throat. Slower heart rate. Lower blood pressure. Clearer and healthier skin. The ability to breathe more easily. Fewer sick days. Summary Quitting smoking can be very challenging. Do not get discouraged if you are not successful the first time. Some people need to make many attempts to quit before they achieve long-term success. When you decide to quit smoking, create a plan to help you succeed. Quit smoking right away, not slowly over a period of time. Find resources and support systems that can help you quit smoking and remain smoke-free after you quit. This information is not intended to replace advice given to you by your health care provider. Make sure you discuss any questions you have with your health care provider. Document Revised: 07/24/2021 Document Reviewed: 07/24/2021 Elsevier Patient Education  [place]

## 2022-03-29 ENCOUNTER — Other Ambulatory Visit: Payer: Self-pay | Admitting: Cardiovascular Disease

## 2022-03-29 MED ORDER — AMLODIPINE BESYLATE 5 MG PO TABS: 5.0000 mg | ORAL_TABLET | Freq: Every day | ORAL | 6 refills | Status: DC

## 2022-03-29 NOTE — Telephone Encounter (Signed)
*  STAT* If patient is at the pharmacy, call can be transferred to refill team.   1. Which medications need to be refilled? (please list name of each medication and dose if known)   amLODipine (NORVASC) 5 MG tablet  2. Which pharmacy/location (including street and city if local pharmacy) is medication to be sent to?  [facility] [facility], [place]  3. Do they need a 30 day or 90 day supply? 30 day  Patient called stating he is completely out of this medication.

## 2022-03-29 NOTE — Telephone Encounter (Signed)
amLODipine (NORVASC) 5 MG tablet 30 tablet 6 03/29/2022    Sig - Route: Take 1 tablet (5 mg total) by mouth daily. - Oral   Sent to pharmacy as: amLODipine (NORVASC) 5 MG tablet   E-Prescribing Status: Receipt confirmed by pharmacy (03/29/2022  9:25 AM EDT)

## 2022-06-14 ENCOUNTER — Encounter (INDEPENDENT_AMBULATORY_CARE_PROVIDER_SITE_OTHER): Payer: Self-pay

## 2022-08-03 NOTE — Progress Notes (Unsigned)
MRN : [id number]  Dale Becker is a 82 y.o. ([birth year]) male who presents with chief complaint of check circulation.  History of Present Illness:   The patient returns to the office for surveillance of an abdominal aortic aneurysm status post stent graft placement on 12/30/2021.    Patient denies abdominal pain or back pain, no other abdominal complaints. No groin related complaints. No symptoms consistent with distal embolization No changes in claudication distance or new rest pain symptoms.  Patient incidentally did have issues with persistent hiccups but this is resolved.   There have been no interval changes in his overall healthcare since his last visit.    Patient denies amaurosis fugax or TIA symptoms.  The patient denies recent episodes of angina or shortness of breath.    Duplex US of the aorta and iliac arteries shows a 4.95 AAA sac with no endoleak, no change in the sac compared to the previous study.   No outpatient medications have been marked as taking for the 08/05/22 encounter (Appointment) with Delana Meyer, Dolores Lory, MD.    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 LHC showed moderate nonobstructive disease in the LCa and pRCA with an FFR ratio of 0.88 and 0.90 respectively. Hyperdamic EF of 70%. MI was likely due to plaque rupture without obstructive disease   Dyspnea    History of echocardiogram    a. 02/2013 Echo: EF 55-60%, mild TR.   Hyperlipidemia    Hypertension    NSTEMI (non-ST elevated myocardial infarction) ([place]) 01/2013   a.) LHC 01/25/2013: EF 70%; 20% mLAD, 20% oD1, 40%  oD2, 60% mLCx, 60% pRCA; 20% mRCA, 20% RPLV; intervention deferred opting for medical mgmt as MI felt to be secondary to plaque rupture rather than obstructive disease.   Tobacco use     Past Surgical History:  Procedure Laterality Date   BRAIN TUMOR EXCISION     CARDIAC CATHETERIZATION  01/25/2013   Procedure: CARDIAC CATHETHERIZATION; Location: [place]; Surgeon: Kathlyn Sacramento, MD   ENDOVASCULAR REPAIR/STENT GRAFT N/A 12/30/2021   Procedure: ENDOVASCULAR REPAIR/STENT GRAFT;  Surgeon: Katha Cabal, MD;  Location: [place] [facility];  Service: Cardiovascular;  Laterality: N/A;    Social History Social History   Tobacco Use   Smoking status: Every Day    Packs/day: 1.00    Years: 50.00    Total pack years: 50.00    Types: Cigarettes   Smokeless tobacco: Never  Vaping Use   Vaping Use: Never used  Substance Use Topics   Alcohol use: Yes    Alcohol/week: 1.0 standard drink of alcohol    Types: 1 Cans of beer per week    Comment: 1 beer week   Drug use: No    Family History Family History  Family history unknown: Yes    Allergies  Allergen Reactions   Atorvastatin     Myalgias    Lisinopril Other (See Comments) and Hives    Angioedema. Angioedema.   Rosuvastatin  Myalgias    Simvastatin     Myalgias      REVIEW OF SYSTEMS (Negative unless checked)  Constitutional: '[]'$ Weight loss  '[]'$ Fever  '[]'$ Chills Cardiac: '[]'$ Chest pain   '[]'$ Chest pressure   '[]'$ Palpitations   '[]'$ Shortness of breath when laying flat   '[]'$ Shortness of breath with exertion. Vascular:  '[x]'$ Pain in legs with walking   '[]'$ Pain in legs at rest  '[]'$ History of DVT   '[]'$ Phlebitis   '[]'$ Swelling in legs   '[]'$ Varicose veins   '[]'$ Non-healing ulcers Pulmonary:   '[]'$ Uses home oxygen   '[]'$ Productive cough   '[]'$ Hemoptysis   '[]'$ Wheeze  '[]'$ COPD   '[]'$ Asthma Neurologic:  '[]'$ Dizziness   '[]'$ Seizures   '[]'$ History of stroke   '[]'$ History of TIA  '[]'$ Aphasia   '[]'$ Vissual changes   '[]'$ Weakness or numbness in arm   '[]'$ Weakness or numbness in  leg Musculoskeletal:   '[]'$ Joint swelling   '[]'$ Joint pain   '[]'$ Low back pain Hematologic:  '[]'$ Easy bruising  '[]'$ Easy bleeding   '[]'$ Hypercoagulable state   '[]'$ Anemic Gastrointestinal:  '[]'$ Diarrhea   '[]'$ Vomiting  '[]'$ Gastroesophageal reflux/heartburn   '[]'$ Difficulty swallowing. Genitourinary:  '[]'$ Chronic kidney disease   '[]'$ Difficult urination  '[]'$ Frequent urination   '[]'$ Blood in urine Skin:  '[]'$ Rashes   '[]'$ Ulcers  Psychological:  '[]'$ History of anxiety   '[]'$  History of major depression.  Physical Examination  There were no vitals filed for this visit. There is no height or weight on file to calculate BMI. Gen: WD/WN, NAD Head: [place]/AT, No temporalis wasting.  Ear/Nose/Throat: Hearing grossly intact, nares w/o erythema or drainage Eyes: PER, EOMI, sclera nonicteric.  Neck: Supple, no masses.  No bruit or JVD.  Pulmonary:  Good air movement, no audible wheezing, no use of accessory muscles.  Cardiac: RRR, normal S1, S2, no Murmurs. Vascular:  mild trophic changes, no open wounds Vessel Right Left  Radial Palpable Palpable  PT Not Palpable Not Palpable  DP Not Palpable Not Palpable  Gastrointestinal: soft, non-distended. No guarding/no peritoneal signs.  Musculoskeletal: M/S 5/5 throughout.  No visible deformity.  Neurologic: CN 2-12 intact. Pain and light touch intact in extremities.  Symmetrical.  Speech is fluent. Motor exam as listed above. Psychiatric: Judgment intact, Mood & affect appropriate for pt's clinical situation. Dermatologic: No rashes or ulcers noted.  No changes consistent with cellulitis.   CBC Lab Results  Component Value Date   WBC 10.6 (H) 12/31/2021   HGB 12.8 (L) 12/31/2021   HCT 36.9 (L) 12/31/2021   MCV 86.8 12/31/2021   PLT 159 12/31/2021    BMET    Component Value Date/Time   NA 137 12/31/2021 0336   NA 141 05/21/2021 0946   NA 131 (L) 07/20/2013 1301   K 3.7 12/31/2021 0336   K 3.5 07/20/2013 1301   CL 110 12/31/2021 0336   CL 97 (L) 07/20/2013 1301   CO2 22  12/31/2021 0336   CO2 32 07/20/2013 1301   GLUCOSE 102 (H) 12/31/2021 0336   GLUCOSE 124 (H) 07/20/2013 1301   BUN 15 12/31/2021 0336   BUN 9 05/21/2021 0946   BUN 12 07/20/2013 1301   CREATININE 1.10 12/31/2021 0336   CREATININE 1.22 07/20/2013 1301   CALCIUM 8.9 12/31/2021 0336   CALCIUM 8.8 07/20/2013 1301   GFRNONAA >60 12/31/2021 0336   GFRNONAA 60 (L) 07/20/2013 1301   GFRAA 69 03/18/2020 0957   GFRAA >60 07/20/2013 1301   CrCl cannot be calculated (Patient's most recent lab result is older than the maximum 21 days allowed.).  COAG Lab Results  Component Value Date   INR 1.2 12/30/2021   INR 1.1 01/25/2013    Radiology No results found.   Assessment/Plan There are no diagnoses linked to this encounter.   Hortencia Pilar, MD  08/03/2022 12:39 PM

## 2022-08-04 ENCOUNTER — Other Ambulatory Visit (INDEPENDENT_AMBULATORY_CARE_PROVIDER_SITE_OTHER): Payer: Self-pay | Admitting: Vascular Surgery

## 2022-08-04 DIAGNOSIS — I7143 Infrarenal abdominal aortic aneurysm, without rupture: Secondary | ICD-10-CM

## 2022-08-05 ENCOUNTER — Encounter (INDEPENDENT_AMBULATORY_CARE_PROVIDER_SITE_OTHER): Payer: Self-pay | Admitting: Vascular Surgery

## 2022-08-05 ENCOUNTER — Ambulatory Visit (INDEPENDENT_AMBULATORY_CARE_PROVIDER_SITE_OTHER): Payer: 59 | Admitting: Vascular Surgery

## 2022-08-05 ENCOUNTER — Ambulatory Visit (INDEPENDENT_AMBULATORY_CARE_PROVIDER_SITE_OTHER): Payer: 59

## 2022-08-05 VITALS — BP 148/82 | HR 60 | Resp 16 | Wt 154.4 lb

## 2022-08-05 DIAGNOSIS — I7143 Infrarenal abdominal aortic aneurysm, without rupture: Secondary | ICD-10-CM | POA: Diagnosis not present

## 2022-08-05 DIAGNOSIS — I10 Essential (primary) hypertension: Secondary | ICD-10-CM | POA: Diagnosis not present

## 2022-08-05 DIAGNOSIS — E785 Hyperlipidemia, unspecified: Secondary | ICD-10-CM | POA: Diagnosis not present

## 2022-08-05 DIAGNOSIS — I2510 Atherosclerotic heart disease of native coronary artery without angina pectoris: Secondary | ICD-10-CM

## 2022-08-17 IMAGING — CT CT CTA ABD/PEL W/CM AND/OR W/O CM
2 of 7 series · 15 of 46 positions shown, 17 images · IV contrast (agent unspecified)
Comparison: None.

CLINICAL DATA: Aortic aneurysm, preop planning

EXAM:
CTA ABDOMEN AND PELVIS WITHOUT AND WITH CONTRAST
TECHNIQUE: Multidetector CT imaging of the abdomen and pelvis was performed
using the standard protocol during bolus administration of
intravenous contrast. Multiplanar reconstructed images and MIPs were
obtained and reviewed to evaluate the vascular anatomy.

[Series 4: axial arterial · axial · arterial · 0.76mm/px · z∈[-917,-487]mm · 12 of 243 slices shown, 14 images]
[im 14/243  soft-tissue]
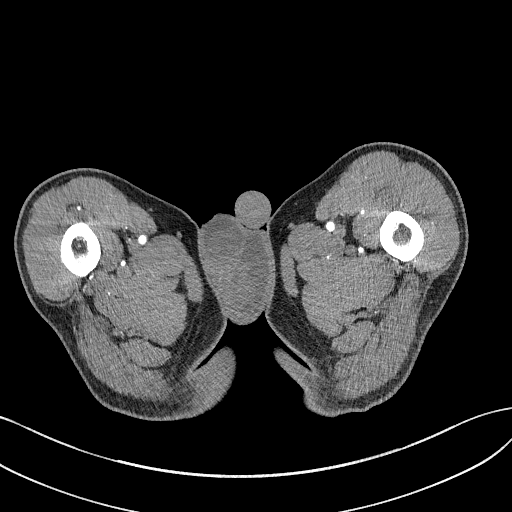
[im 14/243  bone]
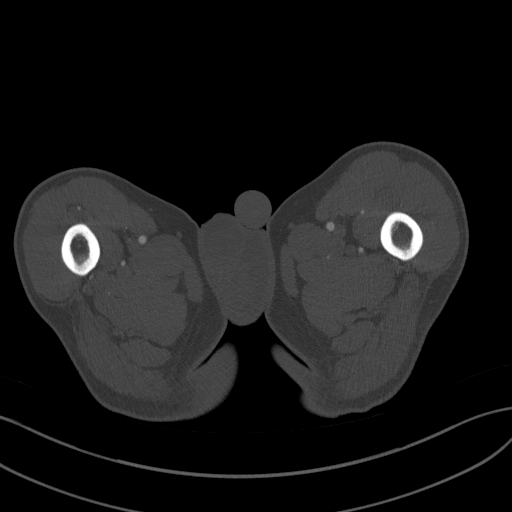
[im 41/243  soft-tissue]
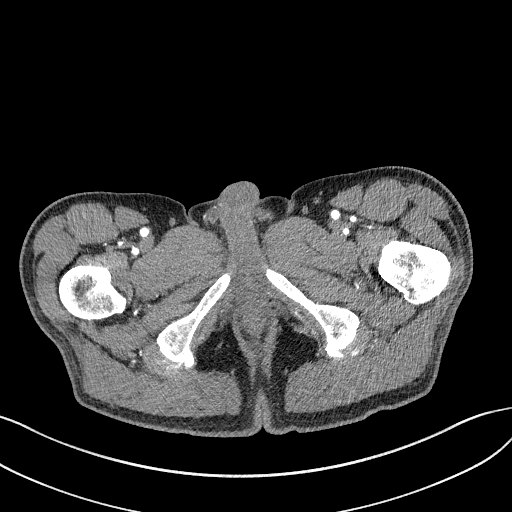
[im 54/243  soft-tissue]
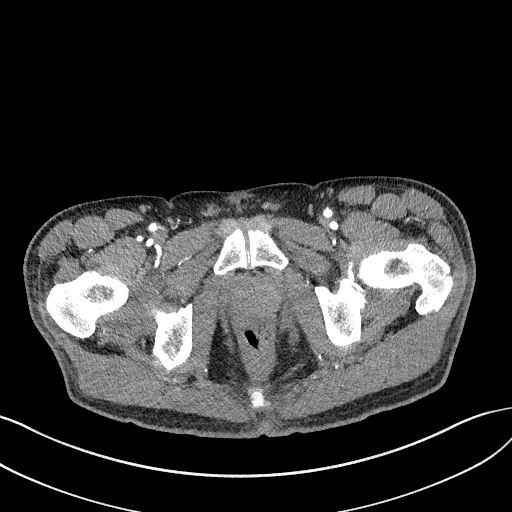
[im 68/243  soft-tissue]
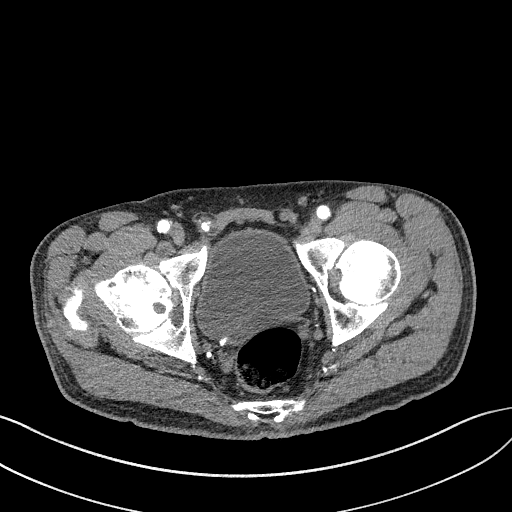
[im 95/243  soft-tissue]
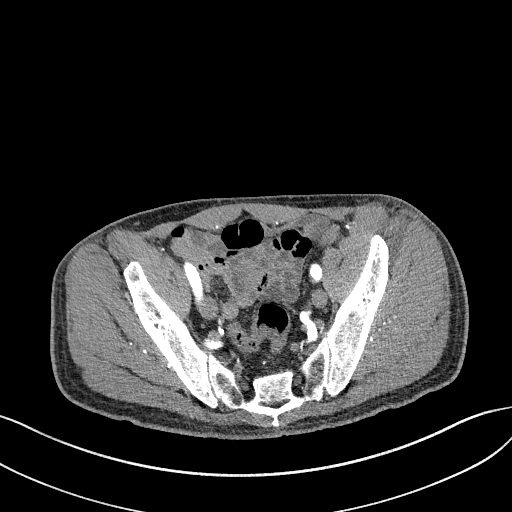
[im 108/243  soft-tissue]
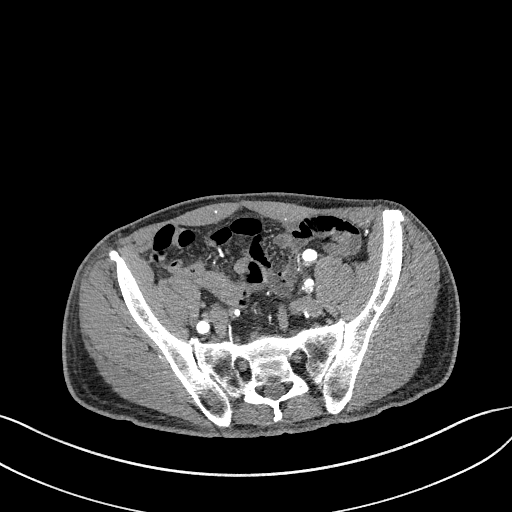
[im 135/243  soft-tissue]
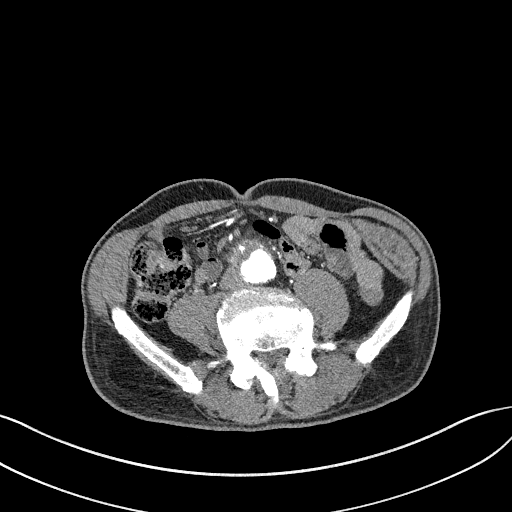
[im 148/243  soft-tissue]
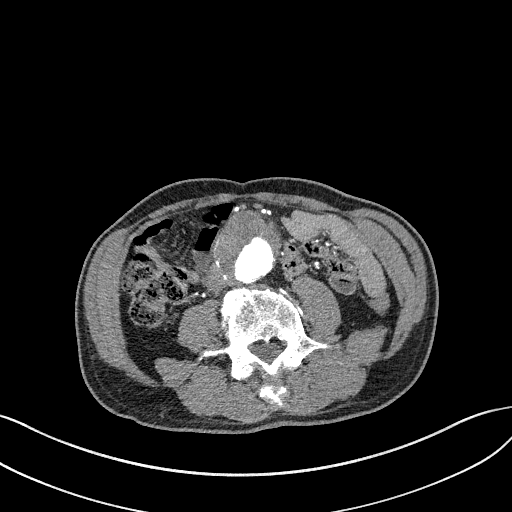
[im 175/243  soft-tissue]
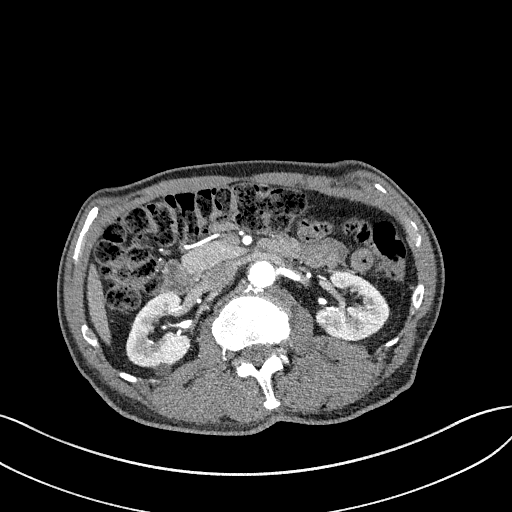
[im 175/243  bone]
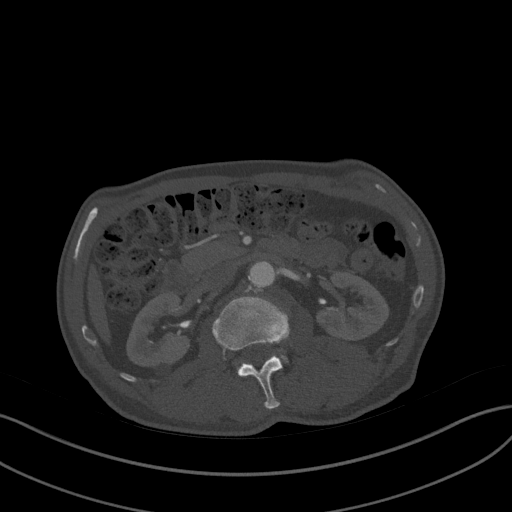
[im 189/243  soft-tissue]
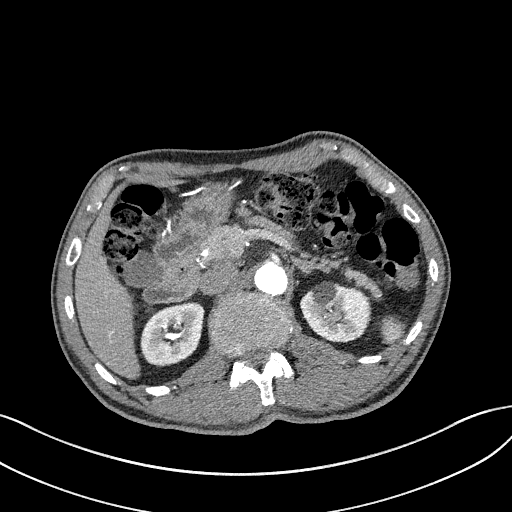
[im 202/243  soft-tissue]
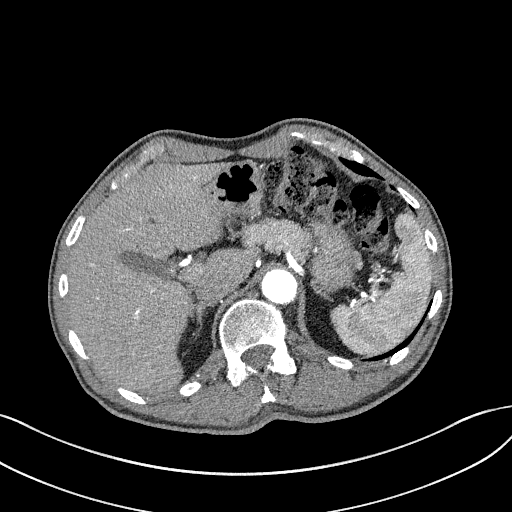
[im 229/243  soft-tissue]
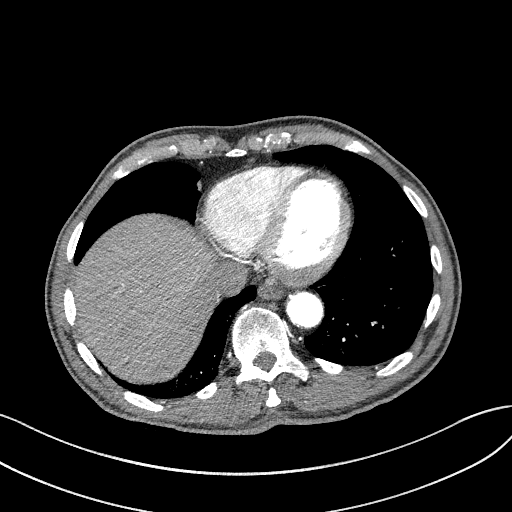

[Series 6: coronal mpr · coronal · 0.68mm/px · 3 of 124 slices shown]
[im 25/124  soft-tissue]
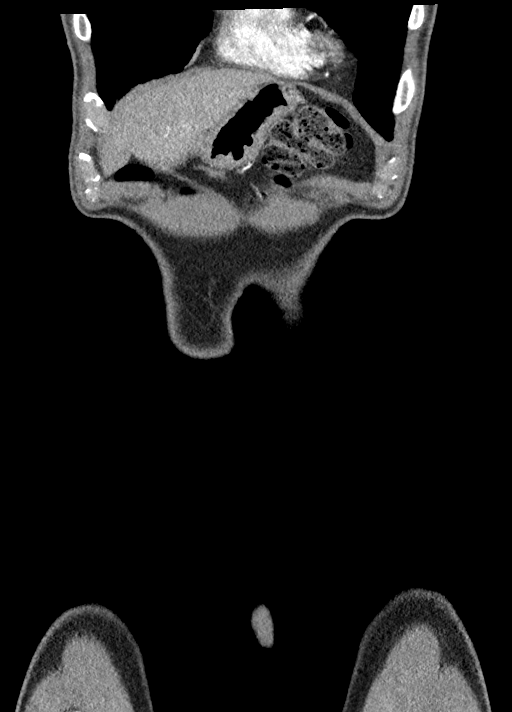
[im 50/124  soft-tissue]
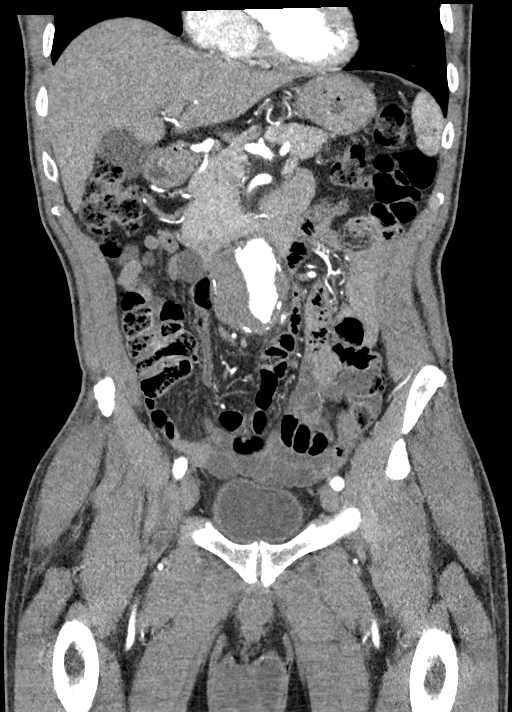
[im 74/124  soft-tissue]
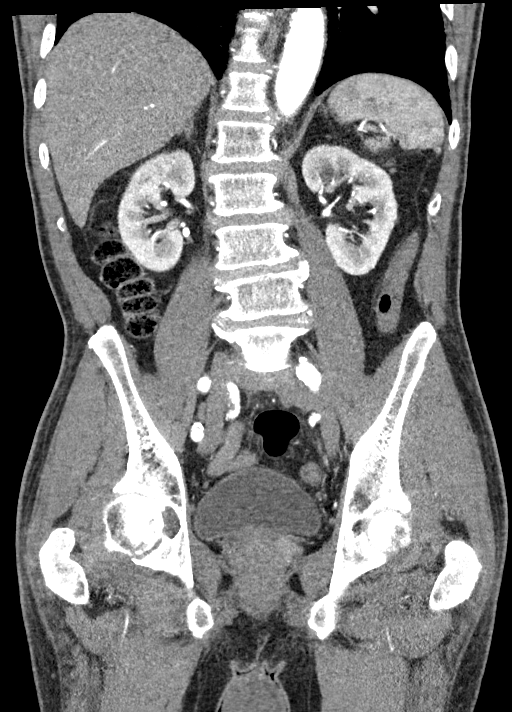

[15 of 46 positions shown; findings below may reference images not displayed]

RADIATION DOSE REDUCTION: This exam was performed according to the
departmental dose-optimization program which includes automated
exposure control, adjustment of the mA and/or kV according to
patient size and/or use of iterative reconstruction technique.

CONTRAST:  75mL OMNIPAQUE IOHEXOL 350 MG/ML SOLN
FINDINGS: VASCULAR

Aorta: Scattered calcified atheromatous plaque. Fusiform infrarenal
aneurysm 5.8 x 5.1 cm maximum transverse dimensions, containing some
eccentric nonocclusive mural thrombus, tapering to normal caliber
just above the bifurcation.

Celiac: Patent without evidence of aneurysm, dissection, vasculitis
or significant stenosis.

SMA: Patent without evidence of aneurysm, dissection, vasculitis or
significant stenosis.

Renals: Single left, widely patent. Single right, with smooth
short-segment ostial stenosis of at least mild hemodynamic
significance, patent distally.

IMA: High-grade origin stenosis, originating from the aneurysmal
segment of the aorta, patent distally.

Inflow: Moderate scattered calcified atheromatous plaque. Mild
tortuosity. No aneurysm or stenosis.

Proximal Outflow: Atheromatous, patent.

Veins: Patent hepatic veins, portal vein, SMV, bilateral renal
veins, iliac venous system and IVC. No venous pathology identified

Review of the MIP images confirms the above findings.

NON-VASCULAR

Lower chest: No pleural or pericardial effusion.

Hepatobiliary: No focal liver abnormality is seen. No gallstones,
gallbladder wall thickening, or biliary dilatation.

Pancreas: Unremarkable. No pancreatic ductal dilatation or
surrounding inflammatory changes.

Spleen: Normal in size without focal abnormality.

Adrenals/Urinary Tract: No adrenal mass. 2 cm 13 HU probable cyst,
upper pole left kidney; no follow-up indicated. No solid renal mass
or hydronephrosis. Urinary bladder is physiologically distended,
mildly thick-walled.

Stomach/Bowel: Stomach is nondistended, unremarkable. Small bowel
decompressed. Normal appendix. The colon is nondilated,
unremarkable.

Lymphatic: No abdominal or pelvic adenopathy.

Reproductive: Mild prostate enlargement. Coarse calcifications in
bilateral inguinal canals, measured up to 1.1 cm on the right.

Other: No ascites.  No free air.

Musculoskeletal: Bilateral hip DJD. Mild lumbar dextroscoliosis apex
L2 with multilevel spondylitic change.
IMPRESSION: 1. 5.8 cm infrarenal abdominal aortic aneurysm. Recommend referral
to a vascular specialist.
Reference: [HOSPITAL] 4017;[DATE].
2. Tortuous atheromatous iliac arterial systems without aneurysm or
stenosis.

## 2022-10-11 ENCOUNTER — Telehealth: Payer: Self-pay | Admitting: Cardiovascular Disease

## 2022-10-11 MED ORDER — PRAVASTATIN SODIUM 40 MG PO TABS: 40.0000 mg | ORAL_TABLET | Freq: Every evening | ORAL | 0 refills | Status: DC

## 2022-10-11 NOTE — Telephone Encounter (Signed)
*  STAT* If patient is at the pharmacy, call can be transferred to refill team.   1. Which medications need to be refilled? (please list name of each medication and dose if known) pravastatin (PRAVACHOL) 40 MG tablet   2. Which pharmacy/location (including street and city if local pharmacy) is medication to be sent to?   [facility] [place], [place]    3. Do they need a 30 day or 90 day supply? [place]

## 2022-10-11 NOTE — Telephone Encounter (Signed)
Please schedule overdue F/U appointment for 90 day refills. Thank you! 

## 2022-10-11 NOTE — Telephone Encounter (Signed)
Requested Prescriptions   Signed Prescriptions Disp Refills   pravastatin (PRAVACHOL) 40 MG tablet 30 tablet 0    Sig: Take 1 tablet (40 mg total) by mouth every evening. PLEASE SCHEDULE OFFICE VISIT FOR FURTHER REFILLS. THANK YOU!    Authorizing Provider: Kathlyn Sacramento A    Ordering User: Raelene Bott, Katrena Stehlin L

## 2022-11-01 ENCOUNTER — Telehealth: Payer: Self-pay | Admitting: Cardiovascular Disease

## 2022-11-01 MED ORDER — AMLODIPINE BESYLATE 5 MG PO TABS: 5.0000 mg | ORAL_TABLET | Freq: Every day | ORAL | 0 refills | Status: DC

## 2022-11-01 NOTE — Telephone Encounter (Signed)
Requested Prescriptions   Signed Prescriptions Disp Refills   amLODipine (NORVASC) 5 MG tablet 90 tablet 0    Sig: Take 1 tablet (5 mg total) by mouth daily.    Authorizing Provider: ARIDA, MUHAMMAD A    Ordering User: NEWCOMER MCCLAIN, Demeco Ducksworth L    

## 2022-11-01 NOTE — Telephone Encounter (Signed)
*  STAT* If patient is at the pharmacy, call can be transferred to refill team.   1. Which medications need to be refilled? (please list name of each medication and dose if known) amLODipine (NORVASC) 5 MG tablet   2. Which pharmacy/location (including street and city if local pharmacy) is medication to be sent to?  [facility] [place], [place]    3. Do they need a 30 day or 90 day supply? [place]

## 2022-11-24 ENCOUNTER — Telehealth: Payer: Self-pay | Admitting: Cardiovascular Disease

## 2022-11-24 MED ORDER — PRAVASTATIN SODIUM 40 MG PO TABS: 40.0000 mg | ORAL_TABLET | Freq: Every evening | ORAL | 0 refills | Status: DC

## 2022-11-24 NOTE — Telephone Encounter (Signed)
*  STAT* If patient is at the pharmacy, call can be transferred to refill team.   1. Which medications need to be refilled? (please list name of each medication and dose if known)   pravastatin (PRAVACHOL) 40 MG tablet    2. Which pharmacy/location (including street and city if local pharmacy) is medication to be sent to?  [facility] [address] [place] - [address]    3. Do they need a 30 day or 90 day supply? 30 day   Pt has scheduled appt on 02/15/23 and only has 1 tablet

## 2023-01-11 ENCOUNTER — Telehealth: Payer: Self-pay | Admitting: Cardiovascular Disease

## 2023-01-11 MED ORDER — PRAVASTATIN SODIUM 40 MG PO TABS: 40.0000 mg | ORAL_TABLET | Freq: Every evening | ORAL | 1 refills | Status: DC

## 2023-01-11 NOTE — Telephone Encounter (Signed)
*  STAT* If patient is at the pharmacy, call can be transferred to refill team.   1. Which medications need to be refilled? (please list name of each medication and dose if known)  pravastatin (PRAVACHOL) 40 MG tablet   2. Which pharmacy/location (including street and city if local pharmacy) is medication to be sent to?  [facility] - [place], [place] - [address]    3. Do they need a 30 day or 90 day supply? 30 day supply   Patient is scheduled to see Dr. Kirke Corin 07/02. He is completely out of medication.

## 2023-01-11 NOTE — Telephone Encounter (Signed)
Requested Prescriptions   Signed Prescriptions Disp Refills   pravastatin (PRAVACHOL) 40 MG tablet 30 tablet 1    Sig: Take 1 tablet (40 mg total) by mouth every evening. PLEASE KEEP SCHEDULED OFFICE VISIT FOR FURTHER REFILLS. THANK YOU!    Authorizing Provider: Lorine Bears A    Ordering User: Guerry Minors

## 2023-01-12 NOTE — Telephone Encounter (Signed)
Patient is following up. He states the pharmacy told him they do not have the prescription and he is completely out of medication.

## 2023-01-12 NOTE — Telephone Encounter (Signed)
Patient has been made aware that a call was placed to the pharmacy and that they will have it ready for him today.

## 2023-02-07 ENCOUNTER — Telehealth: Payer: Self-pay | Admitting: Cardiovascular Disease

## 2023-02-07 MED ORDER — AMLODIPINE BESYLATE 5 MG PO TABS: 5.0000 mg | ORAL_TABLET | Freq: Every day | ORAL | 0 refills | Status: DC

## 2023-02-07 NOTE — Telephone Encounter (Signed)
Requested Prescriptions   Signed Prescriptions Disp Refills   amLODipine (NORVASC) 5 MG tablet 30 tablet 0    Sig: Take 1 tablet (5 mg total) by mouth daily.    Authorizing Provider: Lorine Bears A    Ordering User: Thayer Headings, Aidian Salomon L

## 2023-02-07 NOTE — Telephone Encounter (Signed)
*  STAT* If patient is at the pharmacy, call can be transferred to refill team.   1. Which medications need to be refilled? (please list name of each medication and dose if known)   amLODipine (NORVASC) 5 MG tablet    2. Which pharmacy/location (including street and city if local pharmacy) is medication to be sent to?   [facility] - [place], [place] - [address]    3. Do they need a 30 day or 90 day supply? 30

## 2023-02-15 ENCOUNTER — Encounter: Payer: Self-pay | Admitting: Cardiovascular Disease

## 2023-02-15 ENCOUNTER — Ambulatory Visit: Payer: 59 | Attending: Cardiovascular Disease | Admitting: Cardiovascular Disease

## 2023-02-15 VITALS — BP 132/90 | HR 73 | Ht 72.0 in | Wt 156.0 lb

## 2023-02-15 DIAGNOSIS — I714 Abdominal aortic aneurysm, without rupture, unspecified: Secondary | ICD-10-CM

## 2023-02-15 DIAGNOSIS — I2510 Atherosclerotic heart disease of native coronary artery without angina pectoris: Secondary | ICD-10-CM | POA: Diagnosis not present

## 2023-02-15 DIAGNOSIS — I10 Essential (primary) hypertension: Secondary | ICD-10-CM

## 2023-02-15 DIAGNOSIS — Z720 Tobacco use: Secondary | ICD-10-CM

## 2023-02-15 DIAGNOSIS — E785 Hyperlipidemia, unspecified: Secondary | ICD-10-CM

## 2023-02-15 NOTE — Progress Notes (Signed)
Cardiology Office Note   Date:  02/15/2023   ID:  [name], DOB [birth year], MRN [id number]  PCP:  Dorothey Baseman, MD  Cardiologist:   Lorine Bears, MD   Chief Complaint  Patient presents with   Follow-up      History of Present Illness: Dale Becker is a 82 y.o. male who presents for  a followup visit regarding coronary artery disease.  He has chronic medical conditions including essential hypertension, hyperlipidemia, tobacco use, abdominal aortic aneurysm status post endovascular repair and brain tumor status post resection.  He Had non-ST elevation myocardial infarction in June 2014. Echocardiogram showed normal LV and RV systolic function without significant valvular abnormalities. He underwent cardiac catheterization which showed moderate disease in the mid left circumflex and proximal RCA. Pressure wire interrogation was performed on both and was not obstructive. The left circumflex appeared slightly hazy and possibly the site of a plaque rupture .  He was treated medically.  He did not tolerate atorvastatin, simvastatin or rosuvastatin due to myalgia.   He is status post endovascular repair of abdominal aortic aneurysm in May 2023 by Dr. Gilda Crease.  He has been doing well with no recent chest pain, shortness of breath or palpitations.  He continues to smoke.   Past Medical History:  Diagnosis Date   AAA (abdominal aortic aneurysm) ([facility])    a.) 09/2016 Ao Duplex: 3.2 x 3.4 cm juxtarenal AAA - 3.1 cm in length; b.) 09/2017 Ao Duplex: 3.4cm; c.) 05/2021 Ao Duplex: 4.4cm; d.) 11/17/2021 CTA: 5.8 x 5.1 cm   Aortic atherosclerosis ([facility])    Arthritis    Brain tumor ([facility])    a.) s/p resection   Coronary artery disease 01/2013   a.) 01/2013 NSTEMI (peak troponin of 22). LHC showed moderate nonobstructive disease in the LCa and pRCA with an FFR ratio of 0.88 and 0.90 respectively. Hyperdamic EF of 70%. MI was likely due to plaque rupture without obstructive disease    Dyspnea    History of echocardiogram    a. 02/2013 Echo: EF 55-60%, mild TR.   Hyperlipidemia    Hypertension    NSTEMI (non-ST elevated myocardial infarction) ([facility]) 01/2013   a.) LHC 01/25/2013: EF 70%; 20% mLAD, 20% oD1, 40% oD2, 60% mLCx, 60% pRCA; 20% mRCA, 20% RPLV; intervention deferred opting for medical mgmt as MI felt to be secondary to plaque rupture rather than obstructive disease.   Tobacco use     Past Surgical History:  Procedure Laterality Date   BRAIN TUMOR EXCISION     CARDIAC CATHETERIZATION  01/25/2013   Procedure: CARDIAC CATHETHERIZATION; Location: [facility]; Surgeon: Lorine Bears, MD   ENDOVASCULAR REPAIR/STENT GRAFT N/A 12/30/2021   Procedure: ENDOVASCULAR REPAIR/STENT GRAFT;  Surgeon: Renford Dills, MD;  Location: [facility] INVASIVE CV LAB;  Service: Cardiovascular;  Laterality: N/A;     Current Outpatient Medications  Medication Sig Dispense Refill   amLODipine (NORVASC) 5 MG tablet Take 1 tablet (5 mg total) by mouth daily. 30 tablet 0   aspirin 81 MG tablet Take 81 mg by mouth daily.     ezetimibe (ZETIA) 10 MG tablet Take 10 mg by mouth daily.     ibuprofen (ADVIL) 200 MG tablet Take 200 mg by mouth every 6 (six) hours as needed.     NITROSTAT 0.4 MG SL tablet Place 0.4 mg under the tongue every 5 (five) minutes as needed for chest pain.     pravastatin (PRAVACHOL) 40 MG tablet Take 1 tablet (40 mg total) by  mouth every evening. PLEASE KEEP SCHEDULED OFFICE VISIT FOR FURTHER REFILLS. THANK YOU! 30 tablet 1   Simethicone 125 MG CAPS Take 1 capsule (125 mg total) by mouth every 6 (six) hours as needed. 28 capsule 0   No current facility-administered medications for this visit.    Allergies:   Atorvastatin, Lisinopril, Rosuvastatin, and Simvastatin    Social History:  The patient  reports that he has been smoking cigarettes. He has a 37.50 pack-year smoking history. He has never used smokeless tobacco. He reports current alcohol use of about 1.0 standard drink  of alcohol per week. He reports that he does not use drugs.   Family History:  The patient's Family history is unknown by patient.    ROS:  Please see the history of present illness.   Otherwise, review of systems are positive for none.   All other systems are reviewed and negative.    PHYSICAL EXAM: VS:  BP (!) 132/90 (BP Location: Left Arm, Patient Position: Sitting, Cuff Size: Normal)   Pulse 73   Ht 6' (1.829 m)   Wt 156 lb (70.8 kg)   SpO2 97%   BMI 21.16 kg/m  , BMI Body mass index is 21.16 kg/m. GEN: Well nourished, well developed, in no acute distress  HEENT: normal  Neck: no JVD, carotid bruits, or masses Cardiac: RRR; no murmurs, rubs, or gallops,no edema  Respiratory:  clear to auscultation bilaterally, normal work of breathing GI: soft, nontender, nondistended, + BS MS: no deformity or atrophy  Skin: warm and dry, no rash Neuro:  Strength and sensation are intact Psych: euthymic mood, full affect   EKG:  EKG is ordered today. The ekg ordered today demonstrates : Normal sinus rhythm Right bundle branch block Left anterior fascicular block  Bifascicular block  When compared with ECG of 23-Dec-2021 09:33, Left anterior fascicular block is now Present       Recent Labs: No results found for requested labs within last 365 days.    Lipid Panel    Component Value Date/Time   CHOL 121 07/27/2021 0819   CHOL 162 01/26/2013 0402   TRIG 72 07/27/2021 0819   TRIG 76 01/26/2013 0402   HDL 32 (L) 07/27/2021 0819   HDL 39 (L) 01/26/2013 0402   CHOLHDL 3.8 07/27/2021 0819   CHOLHDL 4.7 07/09/2020 0832   VLDL 22 07/09/2020 0832   VLDL 15 01/26/2013 0402   LDLCALC 74 07/27/2021 0819   LDLCALC 108 (H) 01/26/2013 0402      Wt Readings from Last 3 Encounters:  02/15/23 156 lb (70.8 kg)  08/05/22 154 lb 6.4 oz (70 kg)  03/16/22 153 lb (69.4 kg)           No data to display            ASSESSMENT AND PLAN:  1.  Coronary artery disease involving  native coronary artery without angina: He has no symptoms suggestive of angina.  Continue medical therapy.  2.  Abdominal aortic aneurysm status post endovascular repair: Followed by vascular surgery.  3.  Tobacco use: I again had a prolonged discussion with him about the importance of smoking cessation.  4.  Hyperlipidemia: Intolerance to potent statins but is tolerating pravastatin and Zetia.  I requested a follow-up lipid and liver profile.  5.  Essential hypertension: Blood pressure is reasonably controlled on amlodipine.    Disposition:   FU in 12 months  Signed,  Lorine Bears, MD  02/15/2023 4:07 PM  [facility]

## 2023-02-15 NOTE — Patient Instructions (Signed)
Medication Instructions:  No changes *If you need a refill on your cardiac medications before your next appointment, please call your pharmacy*   Lab Work: Your provider would like for you to have following labs drawn: CMET, CBC and Lipid.   Please go to the [facility] entrance and check in at the front desk.  You do not need an appointment.  They are open from 7am-6 pm.   If you have labs (blood work) drawn today and your tests are completely normal, you will receive your results only by: MyChart Message (if you have MyChart) OR A paper copy in the mail If you have any lab test that is abnormal or we need to change your treatment, we will call you to review the results.   Testing/Procedures: None ordered   Follow-Up: At [facility], you and your health needs are our priority.  As part of our continuing mission to provide you with exceptional heart care, we have created designated Provider Care Teams.  These Care Teams include your primary Cardiologist (physician) and Advanced Practice Providers (APPs -  Physician Assistants and Nurse Practitioners) who all work together to provide you with the care you need, when you need it.  We recommend signing up for the patient portal called "MyChart".  Sign up information is provided on this After Visit Summary.  MyChart is used to connect with patients for Virtual Visits (Telemedicine).  Patients are able to view lab/test results, encounter notes, upcoming appointments, etc.  Non-urgent messages can be sent to your provider as well.   To learn more about what you can do with MyChart, go to [email or web address].    Your next appointment:   12 month(s)  Provider:   You may see Lorine Bears, MD or one of the following Advanced Practice Providers on your designated Care Team:   Nicolasa Ducking, NP Eula Listen, PA-C Cadence Fransico Michael, PA-C Charlsie Quest, NP

## 2023-02-16 ENCOUNTER — Other Ambulatory Visit
Admission: RE | Admit: 2023-02-16 | Discharge: 2023-02-16 | Disposition: A | Discharge status: Home / Self Care | Payer: 59 | Source: Ambulatory Visit | Attending: Cardiovascular Disease | Admitting: Cardiovascular Disease

## 2023-02-16 DIAGNOSIS — I2510 Atherosclerotic heart disease of native coronary artery without angina pectoris: Secondary | ICD-10-CM | POA: Insufficient documentation

## 2023-02-16 DIAGNOSIS — E785 Hyperlipidemia, unspecified: Secondary | ICD-10-CM | POA: Insufficient documentation

## 2023-02-16 LAB — COMPREHENSIVE METABOLIC PANEL
ALT: 11 U/L (ref 0–44)
AST: 15 U/L (ref 15–41)
Albumin: 4.1 g/dL (ref 3.5–5.0)
Alkaline Phosphatase: 60 U/L (ref 38–126)
Anion gap: 12 (ref 5–15)
BUN: 13 mg/dL (ref 8–23)
CO2: 23 mmol/L (ref 22–32)
Calcium: 9.4 mg/dL (ref 8.9–10.3)
Chloride: 104 mmol/L (ref 98–111)
Creatinine, Ser: 1.08 mg/dL (ref 0.61–1.24)
GFR, Estimated: >60 mL/min (ref >60)
Glucose, Bld: 89 mg/dL (ref 70–99)
Potassium: 3.9 mmol/L (ref 3.5–5.1)
Sodium: 139 mmol/L (ref 135–145)
Total Bilirubin: 0.8 mg/dL (ref 0.3–1.2)
Total Protein: 7.9 g/dL (ref 6.5–8.1)

## 2023-02-16 LAB — LIPID PANEL
Cholesterol: 168 mg/dL (ref 0–200)
HDL: 37 mg/dL — ABNORMAL LOW (ref >40)
LDL Cholesterol: 112 mg/dL — ABNORMAL HIGH (ref 0–99)
Total CHOL/HDL Ratio: 4.5 RATIO
Triglycerides: 93 mg/dL (ref <150)
VLDL: 19 mg/dL (ref 0–40)

## 2023-02-16 LAB — CBC
HCT: 46.3 % (ref 39.0–52.0)
Hemoglobin: 15.6 g/dL (ref 13.0–17.0)
MCH: 30.5 pg (ref 26.0–34.0)
MCHC: 33.7 g/dL (ref 30.0–36.0)
MCV: 90.4 fL (ref 80.0–100.0)
Platelets: 150 10*3/uL (ref 150–400)
RBC: 5.12 MIL/uL (ref 4.22–5.81)
RDW: 13.2 % (ref 11.5–15.5)
WBC: 3.5 10*3/uL — ABNORMAL LOW (ref 4.0–10.5)
nRBC: 0 % (ref 0.0–0.2)

## 2023-03-08 ENCOUNTER — Telehealth: Payer: Self-pay | Admitting: Cardiovascular Disease

## 2023-03-08 MED ORDER — AMLODIPINE BESYLATE 5 MG PO TABS: 5.0000 mg | ORAL_TABLET | Freq: Every day | ORAL | 3 refills | Status: DC

## 2023-03-08 NOTE — Telephone Encounter (Signed)
*  STAT* If patient is at the pharmacy, call can be transferred to refill team.   1. Which medications need to be refilled? (please list name of each medication and dose if known)   amLODipine (NORVASC) 5 MG tablet   2. Would you like to learn more about the convenience, safety, & potential cost savings by using the [facility]?    3. Are you open to using the [facility] (Type [facility]. ).   4. Which pharmacy/location (including street and city if local pharmacy) is medication to be sent to?  [facility] [address], [place] - [address]   5. Do they need a 30 day or 90 day supply?   30 day  Patient stated he is almost out of this medication.

## 2023-03-08 NOTE — Telephone Encounter (Signed)
Requested Prescriptions   Signed Prescriptions Disp Refills   amLODipine (NORVASC) 5 MG tablet 90 tablet 3    Sig: Take 1 tablet (5 mg total) by mouth daily.    Authorizing Provider: Lorine Bears A    Ordering User: Guerry Minors

## 2023-08-03 ENCOUNTER — Other Ambulatory Visit (INDEPENDENT_AMBULATORY_CARE_PROVIDER_SITE_OTHER): Payer: Self-pay | Admitting: Vascular Surgery

## 2023-08-03 DIAGNOSIS — I714 Abdominal aortic aneurysm, without rupture, unspecified: Secondary | ICD-10-CM

## 2023-08-07 NOTE — Progress Notes (Signed)
MRN : [id number]  Dale Becker is a 82 y.o. (06-19-1941) male who presents with chief complaint of check circulation.  History of Present Illness:   The patient returns to the office for surveillance of an abdominal aortic aneurysm status post stent graft placement on 12/30/2021.    Patient denies abdominal pain or back pain, no other abdominal complaints. No groin related complaints. No symptoms consistent with distal embolization No changes in claudication distance or new rest pain symptoms.  Patient incidentally did have issues with persistent hiccups but this is resolved.   There have been no interval changes in his overall healthcare since his last visit.    Patient denies amaurosis fugax or TIA symptoms.  The patient denies recent episodes of angina or shortness of breath.    Duplex US of the aorta and iliac arteries shows a 4.00 AAA sac with no endoleak, decrease in the sac compared to the previous study.  Moderate elevation of the velocities in the left iliac are noted.  No outpatient medications have been marked as taking for the 08/08/23 encounter (Appointment) with Gilda Crease, Latina Craver, MD.    Past Medical History:  Diagnosis Date   AAA (abdominal aortic aneurysm) (HCC)    a.) 09/2016 Ao Duplex: 3.2 x 3.4 cm juxtarenal AAA - 3.1 cm in length; b.) 09/2017 Ao Duplex: 3.4cm; c.) 05/2021 Ao Duplex: 4.4cm; d.) 11/17/2021 CTA: 5.8 x 5.1 cm   Aortic atherosclerosis (HCC)    Arthritis    Brain tumor (HCC)    a.) s/p resection   Coronary artery disease 01/2013   a.) 01/2013 NSTEMI (peak troponin of 22). LHC showed moderate nonobstructive disease in the LCa and pRCA with an FFR ratio of 0.88 and 0.90 respectively. Hyperdamic EF of 70%. MI was likely due to plaque rupture without obstructive disease   Dyspnea    History of echocardiogram    a. 02/2013 Echo: EF 55-60%, mild TR.   Hyperlipidemia    Hypertension     NSTEMI (non-ST elevated myocardial infarction) (HCC) 01/2013   a.) LHC 01/25/2013: EF 70%; 20% mLAD, 20% oD1, 40% oD2, 60% mLCx, 60% pRCA; 20% mRCA, 20% RPLV; intervention deferred opting for medical mgmt as MI felt to be secondary to plaque rupture rather than obstructive disease.   Tobacco use     Past Surgical History:  Procedure Laterality Date   BRAIN TUMOR EXCISION     CARDIAC CATHETERIZATION  01/25/2013   Procedure: CARDIAC CATHETHERIZATION; Location: [facility]; Surgeon: Lorine Bears, MD   ENDOVASCULAR REPAIR/STENT GRAFT N/A 12/30/2021   Procedure: ENDOVASCULAR REPAIR/STENT GRAFT;  Surgeon: Renford Dills, MD;  Location: [facility] INVASIVE CV LAB;  Service: Cardiovascular;  Laterality: N/A;    Social History Social History   Tobacco Use   Smoking status: Every Day    Current packs/day: 0.75    Average packs/day: 0.8 packs/day for 50.0 years (37.5 ttl pk-yrs)    Types: Cigarettes   Smokeless tobacco: Never  Vaping Use   Vaping status: Never Used  Substance Use Topics   Alcohol use: Yes    Alcohol/week: 1.0 standard drink of alcohol  Types: 1 Cans of beer per week    Comment: 1-2 beers week   Drug use: No    Family History Family History  Family history unknown: Yes    Allergies  Allergen Reactions   Atorvastatin     Myalgias    Lisinopril Other (See Comments) and Hives    Angioedema. Angioedema.   Rosuvastatin     Myalgias    Simvastatin     Myalgias      REVIEW OF SYSTEMS (Negative unless checked)  Constitutional: [] Weight loss  [] Fever  [] Chills Cardiac: [] Chest pain   [] Chest pressure   [] Palpitations   [] Shortness of breath when laying flat   [] Shortness of breath with exertion. Vascular:  [x] Pain in legs with walking   [] Pain in legs at rest  [] History of DVT   [] Phlebitis   [] Swelling in legs   [] Varicose veins   [] Non-healing ulcers Pulmonary:   [] Uses home oxygen   [] Productive cough   [] Hemoptysis   [] Wheeze  [] COPD   [] Asthma Neurologic:   [] Dizziness   [] Seizures   [] History of stroke   [] History of TIA  [] Aphasia   [] Vissual changes   [] Weakness or numbness in arm   [] Weakness or numbness in leg Musculoskeletal:   [] Joint swelling   [] Joint pain   [] Low back pain Hematologic:  [] Easy bruising  [] Easy bleeding   [] Hypercoagulable state   [] Anemic Gastrointestinal:  [] Diarrhea   [] Vomiting  [] Gastroesophageal reflux/heartburn   [] Difficulty swallowing. Genitourinary:  [] Chronic kidney disease   [] Difficult urination  [] Frequent urination   [] Blood in urine Skin:  [] Rashes   [] Ulcers  Psychological:  [] History of anxiety   []  History of major depression.  Physical Examination  There were no vitals filed for this visit. There is no height or weight on file to calculate BMI. Gen: WD/WN, NAD Head: Ong/AT, No temporalis wasting.  Ear/Nose/Throat: Hearing grossly intact, nares w/o erythema or drainage Eyes: PER, EOMI, sclera nonicteric.  Neck: Supple, no masses.  No bruit or JVD.  Pulmonary:  Good air movement, no audible wheezing, no use of accessory muscles.  Cardiac: RRR, normal S1, S2, no Murmurs. Vascular:  mild trophic changes, no open wounds Vessel Right Left  Radial Palpable Palpable  PT Not Palpable Not Palpable  DP Not Palpable Not Palpable  Gastrointestinal: soft, non-distended. No guarding/no peritoneal signs.  Musculoskeletal: M/S 5/5 throughout.  No visible deformity.  Neurologic: CN 2-12 intact. Pain and light touch intact in extremities.  Symmetrical.  Speech is fluent. Motor exam as listed above. Psychiatric: Judgment intact, Mood & affect appropriate for pt's clinical situation. Dermatologic: No rashes or ulcers noted.  No changes consistent with cellulitis.   CBC Lab Results  Component Value Date   WBC 3.5 (L) 02/16/2023   HGB 15.6 02/16/2023   HCT 46.3 02/16/2023   MCV 90.4 02/16/2023   PLT 150 02/16/2023    BMET    Component Value Date/Time   NA 139 02/16/2023 0742   NA 141 05/21/2021 0946    NA 131 (L) 07/20/2013 1301   K 3.9 02/16/2023 0742   K 3.5 07/20/2013 1301   CL 104 02/16/2023 0742   CL 97 (L) 07/20/2013 1301   CO2 23 02/16/2023 0742   CO2 32 07/20/2013 1301   GLUCOSE 89 02/16/2023 0742   GLUCOSE 124 (H) 07/20/2013 1301   BUN 13 02/16/2023 0742   BUN 9 05/21/2021 0946   BUN 12 07/20/2013 1301   CREATININE 1.08 02/16/2023 0742   CREATININE  1.22 07/20/2013 1301   CALCIUM 9.4 02/16/2023 0742   CALCIUM 8.8 07/20/2013 1301   GFRNONAA >60 02/16/2023 0742   GFRNONAA 60 (L) 07/20/2013 1301   GFRAA 69 03/18/2020 0957   GFRAA >60 07/20/2013 1301   CrCl cannot be calculated (Patient's most recent lab result is older than the maximum 21 days allowed.).  COAG Lab Results  Component Value Date   INR 1.2 12/30/2021   INR 1.1 01/25/2013    Radiology No results found.   Assessment/Plan 1. Infrarenal abdominal aortic aneurysm (AAA) without rupture (HCC) (Primary) Recommend:  Patient is status post successful endovascular repair of the AAA.   No further intervention is required at this time.   No endoleak is detected and the aneurysm sac is stable.  The patient will continue antiplatelet therapy as prescribed as well as aggressive management of hyperlipidemia. Exercise is encouraged.   However, endografts require continued surveillance with ultrasound or CT scan. This is mandatory to detect any changes that allow repressurization of the aneurysm sac.  The patient is informed that this would be asymptomatic.  The patient is reminded that lifelong routine surveillance is a necessity with an endograft. Patient will continue to follow-up at the specified interval with ultrasound of the aorta. - VAS US AORTA/IVC/ILIACS; Future  2. Primary hypertension Continue antihypertensive medications as already ordered, these medications have been reviewed and there are no changes at this time.  3. Coronary artery disease involving native coronary artery of native heart without  angina pectoris Continue cardiac and antihypertensive medications as already ordered and reviewed, no changes at this time.  Continue statin as ordered and reviewed, no changes at this time  Nitrates PRN for chest pain  4. Hyperlipidemia, unspecified hyperlipidemia type Continue statin as ordered and reviewed, no changes at this time    Levora Dredge, MD  08/07/2023 5:31 PM

## 2023-08-08 ENCOUNTER — Encounter (INDEPENDENT_AMBULATORY_CARE_PROVIDER_SITE_OTHER): Payer: Self-pay | Admitting: Vascular Surgery

## 2023-08-08 ENCOUNTER — Ambulatory Visit (INDEPENDENT_AMBULATORY_CARE_PROVIDER_SITE_OTHER): Payer: 59

## 2023-08-08 ENCOUNTER — Ambulatory Visit (INDEPENDENT_AMBULATORY_CARE_PROVIDER_SITE_OTHER): Payer: 59 | Admitting: Vascular Surgery

## 2023-08-08 VITALS — BP 158/78 | HR 60 | Resp 16 | Wt 155.4 lb

## 2023-08-08 DIAGNOSIS — I714 Abdominal aortic aneurysm, without rupture, unspecified: Secondary | ICD-10-CM | POA: Diagnosis not present

## 2023-08-08 DIAGNOSIS — E785 Hyperlipidemia, unspecified: Secondary | ICD-10-CM | POA: Diagnosis not present

## 2023-08-08 DIAGNOSIS — I10 Essential (primary) hypertension: Secondary | ICD-10-CM

## 2023-08-08 DIAGNOSIS — I7143 Infrarenal abdominal aortic aneurysm, without rupture: Secondary | ICD-10-CM | POA: Diagnosis not present

## 2023-08-08 DIAGNOSIS — I2510 Atherosclerotic heart disease of native coronary artery without angina pectoris: Secondary | ICD-10-CM

## 2023-08-14 ENCOUNTER — Encounter (INDEPENDENT_AMBULATORY_CARE_PROVIDER_SITE_OTHER): Payer: Self-pay | Admitting: Vascular Surgery

## 2024-01-03 ENCOUNTER — Encounter (INDEPENDENT_AMBULATORY_CARE_PROVIDER_SITE_OTHER): Payer: Self-pay

## 2024-02-14 DEATH — deceased

## 2024-08-06 ENCOUNTER — Ambulatory Visit (INDEPENDENT_AMBULATORY_CARE_PROVIDER_SITE_OTHER): Payer: Medicare Other | Admitting: Vascular Surgery

## 2024-08-06 ENCOUNTER — Encounter (INDEPENDENT_AMBULATORY_CARE_PROVIDER_SITE_OTHER): Payer: Medicare Other
